# Patient Record
Sex: Female | Born: 1970 | Race: Black or African American | Hispanic: No | Smoking: Never smoker
Health system: Southern US, Community
[De-identification: ages and names within clinical notes are randomized; demographics above are authoritative.]

## PROBLEM LIST (undated history)

## (undated) DIAGNOSIS — I639 Cerebral infarction, unspecified: Secondary | ICD-10-CM

## (undated) DIAGNOSIS — N939 Abnormal uterine and vaginal bleeding, unspecified: Secondary | ICD-10-CM

## (undated) DIAGNOSIS — K589 Irritable bowel syndrome without diarrhea: Secondary | ICD-10-CM

## (undated) DIAGNOSIS — K219 Gastro-esophageal reflux disease without esophagitis: Secondary | ICD-10-CM

## (undated) DIAGNOSIS — T8859XA Other complications of anesthesia, initial encounter: Secondary | ICD-10-CM

## (undated) DIAGNOSIS — G4730 Sleep apnea, unspecified: Secondary | ICD-10-CM

## (undated) DIAGNOSIS — R350 Frequency of micturition: Secondary | ICD-10-CM

## (undated) HISTORY — DX: Irritable bowel syndrome, unspecified: K58.9

## (undated) HISTORY — PX: WISDOM TOOTH EXTRACTION: SHX21

## (undated) HISTORY — DX: Cerebral infarction, unspecified: I63.9

---

## 1998-08-06 HISTORY — PX: LEEP: SHX91

## 1998-08-10 ENCOUNTER — Ambulatory Visit (HOSPITAL_COMMUNITY): Admission: RE | Admit: 1998-08-10 | Discharge: 1998-08-10 | Payer: Self-pay | Admitting: Urology

## 1998-08-10 ENCOUNTER — Encounter: Payer: Self-pay | Admitting: Urology

## 1999-06-02 ENCOUNTER — Other Ambulatory Visit: Admission: RE | Admit: 1999-06-02 | Discharge: 1999-06-02 | Payer: Self-pay | Admitting: Obstetrics and Gynecology

## 1999-10-09 ENCOUNTER — Other Ambulatory Visit: Admission: RE | Admit: 1999-10-09 | Discharge: 1999-10-09 | Payer: Self-pay | Admitting: *Deleted

## 1999-11-07 ENCOUNTER — Other Ambulatory Visit: Admission: RE | Admit: 1999-11-07 | Discharge: 1999-11-07 | Payer: Self-pay | Admitting: *Deleted

## 1999-11-07 ENCOUNTER — Encounter (INDEPENDENT_AMBULATORY_CARE_PROVIDER_SITE_OTHER): Payer: Self-pay | Admitting: Specialist

## 1999-12-01 ENCOUNTER — Encounter (INDEPENDENT_AMBULATORY_CARE_PROVIDER_SITE_OTHER): Payer: Self-pay

## 1999-12-01 ENCOUNTER — Ambulatory Visit (HOSPITAL_COMMUNITY): Admission: RE | Admit: 1999-12-01 | Discharge: 1999-12-01 | Payer: Self-pay | Admitting: *Deleted

## 2000-04-18 ENCOUNTER — Other Ambulatory Visit: Admission: RE | Admit: 2000-04-18 | Discharge: 2000-04-18 | Payer: Self-pay | Admitting: Obstetrics & Gynecology

## 2000-08-06 DIAGNOSIS — I639 Cerebral infarction, unspecified: Secondary | ICD-10-CM

## 2000-08-06 HISTORY — DX: Cerebral infarction, unspecified: I63.9

## 2001-07-30 ENCOUNTER — Encounter: Payer: Self-pay | Admitting: Emergency Medicine

## 2001-07-31 ENCOUNTER — Inpatient Hospital Stay (HOSPITAL_COMMUNITY): Admission: EM | Admit: 2001-07-31 | Discharge: 2001-08-05 | Payer: Self-pay | Admitting: Emergency Medicine

## 2001-07-31 ENCOUNTER — Encounter: Payer: Self-pay | Admitting: Emergency Medicine

## 2001-10-03 ENCOUNTER — Ambulatory Visit (HOSPITAL_BASED_OUTPATIENT_CLINIC_OR_DEPARTMENT_OTHER): Admission: RE | Admit: 2001-10-03 | Discharge: 2001-10-03 | Payer: Self-pay | Admitting: Family Medicine

## 2002-01-26 ENCOUNTER — Other Ambulatory Visit: Admission: RE | Admit: 2002-01-26 | Discharge: 2002-01-26 | Payer: Self-pay | Admitting: Obstetrics and Gynecology

## 2003-03-07 ENCOUNTER — Emergency Department (HOSPITAL_COMMUNITY): Admission: EM | Admit: 2003-03-07 | Discharge: 2003-03-08 | Payer: Self-pay | Admitting: Emergency Medicine

## 2005-07-13 ENCOUNTER — Other Ambulatory Visit: Admission: RE | Admit: 2005-07-13 | Discharge: 2005-07-13 | Payer: Self-pay | Admitting: Obstetrics and Gynecology

## 2007-12-08 ENCOUNTER — Other Ambulatory Visit: Admission: RE | Admit: 2007-12-08 | Discharge: 2007-12-08 | Payer: Self-pay | Admitting: Obstetrics and Gynecology

## 2009-01-21 ENCOUNTER — Other Ambulatory Visit: Admission: RE | Admit: 2009-01-21 | Discharge: 2009-01-21 | Payer: Self-pay | Admitting: Obstetrics and Gynecology

## 2010-03-02 ENCOUNTER — Other Ambulatory Visit: Admission: RE | Admit: 2010-03-02 | Discharge: 2010-03-02 | Payer: Self-pay | Admitting: Obstetrics and Gynecology

## 2010-06-16 ENCOUNTER — Emergency Department (HOSPITAL_COMMUNITY): Admission: EM | Admit: 2010-06-16 | Discharge: 2010-06-16 | Payer: Self-pay | Admitting: Emergency Medicine

## 2010-09-10 ENCOUNTER — Emergency Department (HOSPITAL_COMMUNITY): Payer: BC Managed Care – PPO

## 2010-09-10 ENCOUNTER — Emergency Department (HOSPITAL_COMMUNITY)
Admission: EM | Admit: 2010-09-10 | Discharge: 2010-09-10 | Disposition: A | Discharge status: Home / Self Care | Payer: BC Managed Care – PPO | Attending: Emergency Medicine | Admitting: Emergency Medicine

## 2010-09-10 DIAGNOSIS — I10 Essential (primary) hypertension: Secondary | ICD-10-CM | POA: Insufficient documentation

## 2010-09-10 DIAGNOSIS — R1013 Epigastric pain: Secondary | ICD-10-CM | POA: Insufficient documentation

## 2010-09-10 DIAGNOSIS — R0789 Other chest pain: Secondary | ICD-10-CM | POA: Insufficient documentation

## 2010-09-10 DIAGNOSIS — R350 Frequency of micturition: Secondary | ICD-10-CM | POA: Insufficient documentation

## 2010-09-10 DIAGNOSIS — K219 Gastro-esophageal reflux disease without esophagitis: Secondary | ICD-10-CM | POA: Insufficient documentation

## 2010-09-10 DIAGNOSIS — Z8679 Personal history of other diseases of the circulatory system: Secondary | ICD-10-CM | POA: Insufficient documentation

## 2010-09-10 DIAGNOSIS — R110 Nausea: Secondary | ICD-10-CM | POA: Insufficient documentation

## 2010-09-10 LAB — URINALYSIS, ROUTINE W REFLEX MICROSCOPIC
Bilirubin Urine: NEGATIVE
Hgb urine dipstick: NEGATIVE
Specific Gravity, Urine: 1.017 (ref 1.005–1.030)
Urine Glucose, Fasting: NEGATIVE mg/dL
Urobilinogen, UA: 1 mg/dL (ref 0.0–1.0)

## 2010-09-10 LAB — DIFFERENTIAL
Basophils Absolute: 0 10*3/uL (ref 0.0–0.1)
Basophils Relative: 0 % (ref 0–1)
Eosinophils Absolute: 0.1 10*3/uL (ref 0.0–0.7)
Eosinophils Relative: 1 % (ref 0–5)
Lymphocytes Relative: 28 % (ref 12–46)
Lymphs Abs: 2.1 10*3/uL (ref 0.7–4.0)
Monocytes Absolute: 0.4 10*3/uL (ref 0.1–1.0)
Monocytes Relative: 5 % (ref 3–12)
Neutro Abs: 4.9 10*3/uL (ref 1.7–7.7)
Neutrophils Relative %: 66 % (ref 43–77)

## 2010-09-10 LAB — LIPASE, BLOOD: Lipase: 23 U/L (ref 11–59)

## 2010-09-10 LAB — COMPREHENSIVE METABOLIC PANEL
ALT: 30 U/L (ref 0–35)
AST: 45 U/L — ABNORMAL HIGH (ref 0–37)
Alkaline Phosphatase: 66 U/L (ref 39–117)
CO2: 25 mEq/L (ref 19–32)
Chloride: 101 mEq/L (ref 96–112)
GFR calc Af Amer: >60 mL/min (ref >60)
GFR calc non Af Amer: >60 mL/min (ref >60)
Glucose, Bld: 113 mg/dL — ABNORMAL HIGH (ref 70–99)
Potassium: 3.7 mEq/L (ref 3.5–5.1)
Sodium: 137 mEq/L (ref 135–145)
Total Bilirubin: 0.3 mg/dL (ref 0.3–1.2)

## 2010-09-10 LAB — CBC
HCT: 38.6 % (ref 36.0–46.0)
Hemoglobin: 13 g/dL (ref 12.0–15.0)
MCH: 30 pg (ref 26.0–34.0)
MCHC: 33.7 g/dL (ref 30.0–36.0)
MCV: 89.1 fL (ref 78.0–100.0)
Platelets: 241 10*3/uL (ref 150–400)
RBC: 4.33 MIL/uL (ref 3.87–5.11)
RDW: 12.7 % (ref 11.5–15.5)
WBC: 7.5 10*3/uL (ref 4.0–10.5)

## 2010-12-22 NOTE — Op Note (Signed)
[facility]  Patient:    Christine Bernard, Christine Bernard                     MRN: [id number] Proc. Date: 12/01/99 Adm. Date:  [id number] Attending:  Pleas Koch                           Operative Report  PREOPERATIVE DIAGNOSIS:       High grade cervical dysplasia.  POSTOPERATIVE DIAGNOSIS:      High grade cervical dysplasia.  OPERATION PERFORMED:          Loop electrosurgical excision procedure (LEEP).  SURGEON:                      Georgina Peer, M.D.  ANESTHESIA:                   IV sedation with 2% Xylocaine intracervical block  using 8 cc of local.  ESTIMATED BLOOD LOSS:         Less than 20 cc.  COMPLICATIONS:                None.  INDICATIONS:                  A 40 year old gravida 0 with abnormal Pap smear worked up with colposcopy and biopsy showing high grade cervical dysplasia. HPV DNA typing showed high risk virus that was detected.  She is brought in for excision of this abnormal area on the anterior lip of the cervix.  DESCRIPTION OF PROCEDURE:     The patient was taken to the operating room and given an IV sedation.  The speculum was placed in the vagina and acetic acid and Lugols iodine stained the cervix.  Previously noted anterior lip lesion was seen. Injection in the four quadrants of the cervix.  A total of 8 cc caused blanching of the cervix.  A green-handled loop electrode at an 8 mm depth was used to excise the transformation zone.  Ball cautery on the outside of the ectocervix and also in the LEEP crater was accomplished to control hemostasis.  Monsels was placed in the LEEP crater.  All other instruments were removed.  Sponge, needle, and instrument counts were correct.  The patient tolerated the procedure well.  The specimen was cut and marked by pins and a cork board.  Patient returned to the recovery area in good condition.  All counts were correct. DD:  12/03/99 TD:  12/03/99 Job: [id number]

## 2010-12-22 NOTE — Consult Note (Signed)
[place]. [facility]  Patient:    Christine Bernard, Christine Bernard Visit Number: [id number] MRN: [id number]          Service Type: OBV Location: 3000 3027 01 Attending Physician:  Madaline Guthrie Dictated by:   Marlan Palau, M.D. Proc. Date: 08/01/01 Admit Date:  07/30/2001   CC:         [facility]   Consultation Report  HISTORY OF PRESENT ILLNESS:  The patient is a 40 year old right-handed black female born 27-Mar-1971, with a history of obesity and obstructive sleep apnea. This patient comes into [facility] Emergency Room on July 30, 2001, with onset of lethargy, slight confusion, and double vision. The patient is not clear exactly when the deficit began but believes that she as feeling well initially when she got up but then recalls feeling poorly sometime around 9 oclock in the morning when the phone rang. The patient did report dizziness, had poor memory for events during the day of admission, and had some balance problems when up walking. Again, the family reports some confusion. She was reporting double vision and had some slurred speech. The patient does not recall headache or numbness or weakness on the arms, legs, or face. The patient was brought into the emergency room and was set up for further evaluation. Initial head scan was unremarkable, but an MRI scan of the brain done showed evidence of an acute left thalamic stroke with some questionable irregularities of the medium sized arteries on MRI angiogram. This patient has recovered at this point and is back to baseline and neurology was asked to see this patient for further evaluation. A 2-D echocardiogram has been performed, but the results are pending at this time.  PAST MEDICAL HISTORY: 1. History of obesity. 2. History of new-onset left thalamic stroke. 3. History of obstructive sleep apnea. 4. History of marijuana abuse.  CURRENT MEDICATIONS:   Depro-Provera prior to  admission.  SOCIAL HISTORY:  The patient smokes on occasion, drinks on occasion, does use marijuana, denies cocaine. The patient lives in the [place] area, is single, has no children, works as a Armed forces training and education officer.  PAST SURGICAL HISTORY:   Cervical dysplasia. May have had a conization in the past.  FAMILY MEDICAL HISTORY:  Notable for the fact that the mother is alive and well. Father is alive, unknown medical history. The patient has 2 brothers who are alive and well. There is a history of diabetes on the mothers side of the family and on the fathers side of the family. There is heart disease on the mothers side of the family.  REVIEW OF SYSTEMS:  Notable for no fevers or chills. The patient notes occasional headaches once every 2 weeks or so, bifrontal in nature, unassociated with any neurologic symptoms. The patient denies any shortness of breath, chest pains, nausea, vomiting. She denies any blackout episodes in the past. The patient also denies palpitations of the heart.  PHYSICAL EXAMINATION:  VITAL SIGNS:  Blood pressure 105/59, heart rate 82, respirations 20, temperature afebrile.  GENERAL:  This patient is a markedly obese black female who is alert and cooperative at the time of the examination.  HEENT:   Head is atraumatic. Eyes:  Pupils are equal, round, and reactive to light. Disks are flat bilaterally.  NECK:  Supple. No carotid bruits noted.  RESPIRATORY:  Clear.  CARDIOVASCULAR:  Regular rate and rhythm, no obvious murmurs or rubs noted.  EXTREMITIES:  Without significant edema.  NEUROLOGIC:  Cranial nerves as above. Facial symmetry is present. The patient notes good sensation of face to pinprick and soft touch bilaterally. The patient has good strength in facial muscles and the muscles of the head turning and shoulder shrug bilaterally. The patient has full visual fields. Speech is well enunciated, not aphasic. The patient has good strength in all  4 extremities, good symmetric motor tones noted throughout. Sensory testing is intact to pinprick and soft touch, and vibratory sensation throughout. The patient has good finger-to-nose, finger-to-toe-to finger bilaterally. The patient has full ability to ambulate, has fair tandem gait. Romberg negative. No evidence of pronator drift is seen. Deep tendon reflexes are depressed but symmetric. Toes are neutral bilaterally.  LABORATORY AND ACCESSORY DATA:  Notable for sodium 138, potassium 3.4, chloride 108, CO2 23, glucose 94, BUN 12, creatinine 0.7, calcium 8.7.  CPK 94, INR 1.2. White count 7.3, hemoglobin 12.9, hematocrit 37.5, MCV 87.0, platelets 196. TSH 1.621, INR 1.2, calcium 8.7. Urine pregnancy test negative, drug screen positive for THC, otherwise negative.  IMPRESSION: 1. New onset of left thalamic stroke, etiology unclear. 2. Obstructive sleep apnea. 3. History of headache, questionable migraine.  It is possible that this patient may have migraine. The patient, however, has had no prior neurologic symptomatology with the migraine. The patient has sustained an acute stroke at this point and needs to be undertaking a fairly aggressive stroke workup at this point.  PLAN: 1. A 2-D echocardiogram has been performed. Would recommend    transesophageal echocardiogram if the 2-D echocardiogram does not    show any significant abnormalities, need to rule out patent foramen ovale. 2. If not already done, would check antithrombin III level, protein S, protein    C, antiphospholipid antibody profile. 3. Aspirin therapy. 4. Consider discontinuing the Depro-Provera and total smoking    cessation. 5. EKG at this point.  We will follow the patients clinical course while in-house. The patient is essentially baseline at this point. Dictated by:   Marlan Palau, M.D. Attending Physician:  Madaline Guthrie DD:  08/01/01 TD:  08/01/01 Job: [id number]

## 2010-12-22 NOTE — Discharge Summary (Signed)
[place]. [facility]  Patient:    Christine Bernard, Christine Bernard Visit Number: [id number] MRN: [id number]          Service Type: MED Location: 3000 3027 01 Attending Physician:  Alfonso Ramus Dictated by:   Hillery Aldo, M.D. Admit Date:  07/30/2001 Discharge Date: 08/05/2001   CC:         Charles A. Tenny Craw, M.D., [employer]. Med., [facility], [address]                           Discharge Summary  DISCHARGE DIAGNOSES: 1. Thalamic stroke. 2. Obesity. 3. Obstructive sleep apnea. 4. Tobacco abuse. 5. History of marijuana abuse.  DISCHARGE MEDICATIONS WITH DOSAGES: 1. Aspirin 325 mg p.o. q.d. 2. Mega-B vitamin 1 p.o. q.d. 3. Elavil 25 mg p.o. q.h.s.  FOLLOW-UP WITH DATES AND APPOINTMENTS:  The patient is to follow up with her primary care physician, Dr. Leonette Most. ATenny Craw, on 08/13/01 at 3:30 p.m.  At that time, Dr. Tenny Craw will follow up on any outstanding laboratory values including a homocystine level that was drawn prior to the patient being discharged.  PROCEDURES AND DIAGNOSTIC STUDIES: 1. CT of head with and without contrast:  07/30/01:  Negative for acute bleed. 2. MRA/MRI of the brain with and without contrast:  07/31/01:  Acute    nonhemorrhagic infarct involving the left thalmus.  Major intracranial    vascular structures appear intact. 3. TE:  08/01/01:  LV ejection fraction 60%.  Study was inadequate for the    evaluation of left ventricular regional wall motion.  No obvious cardiac    source of embolus. 4. TEE:  08/05/01:  Normal LV function, normal left atrium (no left atrial    thrombus), normal right atrium, normal right ventricle.  No significant    atherosclerosis of descending aorta.  Normal trileaflet, aortic valve.  No    AI. Normal mitral valve, trace mitral regurgitation, trace tricuspid    regurgitation.  No ______  by color.  Negative saline microcavitation    study.  CONSULTANTS:  Marlan Palau, M.D.,  neurology.  ADMISSION HISTORY AND PHYSICAL:  The patient is a 40 year old black female with a history of obesity and obstructive sleep apnea, who came to the emergency department on 07/30/01, with onset of lethargy, slight confusion, and double-vision.  The patient was unable to ascertain when exactly her deficit began but believed that she was feeling well initially when she got up that morning but then felt poorly sometime around 9 a.m.  The patient also reports some dizziness.  The patient had poor memory for the events during the day of admission and had some balance problems when walking.  Notably, the family reported that the patient was confused.  The patient did not report any headache, numbness, or weakness on the arms, legs, or face.  The patient was brought to the emergency department for evaluation.  Initial CT scan of the head was unremarkable, but an MRI scan of the brain done showed evidence of an acute left thalamic stroke with some questionable irregularities of the medium-sized artery on MRI angiogram.  The patient subsequently recovered over the course of her hospitalization and is back to baseline at present. Neurology was asked to see the patient for recommendations.  A vasculitis and coagulopathy work-up was negative.  Additionally, a search for an embolic source was also not found.  The only medication the patient reported prior to  admission was Depo-Provera injections for birth control.  LABORATORY VALUES:  Protein C, normal at 69.  Protein S, normal at 127. C ANCA 1:16, scleroderma antibody normal at 7.  ______ antibody 3.  ESR initially 50, decreased to 35 on 08/02/01.  PT:  14.4, INR:  1.2, PTT:  31. No evidence of factor V Leiden mutation.  Initial chemistries showed a sodium of 142, potassium 3.9, chloride 108, BUN 11, creatinine 1.0, and glucose 88. TSH was 1.621.  Initial CBC showed a white count of 5.9, hemoglobin 13.3, hematocrit 38.4, and platelet count  of 239.  Her RPR was nonreactive.  HOSPITAL COURSE: #1 - NEUROLOGICAL DEFICIT:  The patient stabilized overnight.  She did appear to have some residual confusion on the morning of 07/31/01.  There was a question as to whether some of her symptoms were related to somnolence secondary secondary to obstructive sleep apnea.  She was seen by neurology on 08/01/01.  The patient was placed on aspirin therapy.  A further diagnostic work-up did not reveal any obvious source for her stroke.  Neurology eventually recommended keeping her on aspirin therapy.  The patient was also instructed to discontinue any hormonal-type of birth control and to discontinue smoking.  The risks of not discontinuing these things was discussed with the patient who verbalized understanding.  #2 - OBSTRUCTIVE SLEEP APNEA:  The patient had a sleep study done at the [facility].  She was maintained on Elavil 25 mg at bedtime to suppress REM-related apnea.  The patient was discharged on this medication.  DISPOSITION:  The patient was discharged home on 08/05/01.  The patient was instructed to follow up with her primary care physician, Dr. Tenny Craw, as described above.  Additionally, the patient had a homocystine level drawn prior to her leaving.  She was put on Mega-B vitamin therapy; however, this may be discontinued if her homocystine level is not elevated.  She understands she is to take an aspirin a day for stroke prevention.  She also understands that she will need to avoid any hormonal-type birth control methods.  She was instructed to follow up with her gynecologist for alternative birth control options.  The patient verbalized understanding of these discharge instructions prior to leaving. Dictated by:   Hillery Aldo, M.D. Attending Physician:  Alfonso Ramus DD:  08/05/01 TD:  08/05/01 Job: [id number]

## 2010-12-22 NOTE — Consult Note (Signed)
[place]. [facility]  Patient:    Christine, Bernard Visit Number: [id number] MRN: [id number]          Service Type: OBV Location: 3000 3027 01 Attending Physician:  Madaline Guthrie Dictated by:   Marlan Palau, M.D. Proc. Date: 08/01/01 Admit Date:  07/30/2001   CC:         [facility]  Madaline Guthrie, M.D.   Consultation Report  HISTORY OF PRESENT ILLNESS:  Christine Bernard is a 40 year old right-handed black female born 09/08/1970 with a history of obesity and obstructive sleep apnea.  The patient comes into [facility] Emergency Room on July 30, 2001 with onset of lethargy, slight confusion, double vision.  Patient is not clear exactly when the deficit began, but believes she was feeling well initially when she got up but then recalls feeling poorly some time around 9:00 in the morning when the phone rang.  Patient did report dizziness, had poor memory for events during the day of admission, and had some balance problems when up walking.  Again, the family reports some confusion.  She was reporting double vision.  Had some slurred speech.  Patient does not recall headache, numbness, or weakness on the arms, legs, or face.  Patient was brought in through the emergency room and set up for further evaluation. Initial head scan was unremarkable but an MRI scan of the brain done showed evidence of an acute left thalamic stroke with some questionable irregularities of the medium sized arteries on MR angiogram.  This patient has recovered at this point, is back to baseline, and neurology was asked to see this patient for further evaluation.  A 2-D echocardiogram has been performed but the results are pending at this time.  PAST MEDICAL HISTORY: 1. History of obesity. 2. History of new onset left thalamic stroke. 3. History of obstructive sleep apnea. 4. History of marijuana abuse.  CURRENT MEDICATIONS:  Depo-Provera  prior to admission.  SOCIAL HISTORY:  Patient smokes on occasion, drinks on occasion, does use marijuana.  Denies cocaine.  Patient has a history of cervical dysplasia.  May have had a conization in the past.  Patient lives in the [place] area, is single, has no children.  Works as a Armed forces training and education officer.  FAMILY HISTORY:  Notable for the fact that mother is alive and well.  Father is alive.  Unknown medical history.  Patient has two brothers who are alive and well.  There is a history of diabetes in the mothers side of the family and on the fathers side of the family, heart disease on the mothers side of the family.  REVIEW OF SYSTEMS:  Notable for no fevers, chills.  Patient notes occasional headaches once every two weeks or so, bifrontal in nature, unassociated with any neurologic symptoms.  Patient denies any shortness of breath, chest pains, nausea, vomiting.  Denies any blackout episodes in the past.  Patient also denies palpitations of the heart.  PHYSICAL EXAMINATION  VITAL SIGNS:  Blood pressure 105/59, heart rate 82, respiratory rate 20, temperature afebrile.  GENERAL:  This patient is a markedly obese black female who is alert and cooperative at the time of examination.  HEENT:  Head is atraumatic.  Eyes:  Pupils are equal, round and reactive to light.  Disks are flat bilaterally.  NECK:  Supple.  No carotid bruits noted.  RESPIRATORY:  Clear.  CARDIOVASCULAR:  Regular rate and rhythm.  No obvious murmurs or rubs  noted.  EXTREMITIES:  Without significant edema.  NEUROLOGIC:  Cranial nerves as above.  Facial symmetry is present.  Patient notes good sensation of face to pin prick and soft touch bilaterally.  Patient has good strength to facial muscles and the muscles to head turn and shoulder shrug bilaterally.  Patient has full visual fields.  Speech is well enunciated, not aphasic.  Patient has good strength in all four extremities, good symmetric motor  tone noted throughout.  Sensory testing is intact to pin prick, soft touch, vibratory sensation throughout.  Patient has good finger-to-nose, toe-to-finger bilaterally.  Patient has full ability to ambulate.  Has fair tandem gait.  Romberg negative.  No evidence of pronator drift is seen.  Deep tendon reflexes are depressed, but symmetric.  Toes are neutral bilaterally.  LABORATORIES:  Sodium 138, potassium 3.4, chloride 108, CO2 23, glucose 94, BUN 12, creatinine 0.7, calcium 8.7, CPK 94, INR 1.2.  White count 7.3, hemoglobin 12.9, hematocrit 37.5, MCV 87.0, platelets 196,000.  TSH 1.621. Calcium 8.7.  Urine pregnancy test is negative.  Drug screen positive for THC, otherwise negative.  IMPRESSION: 1. New onset of left thalamic stroke, etiology unclear. 2. Obstructive sleep apnea. 3. History of headache, questionable migraine.  It is possible this patient may have migraine.  Patient, however, has had no prior neurologic symptomatology with the migraine.  Patient had sustained an acute stroke at this point and need to be undertaking fairly aggressive stroke work-up at this point.  PLAN: 1. A 2-D echocardiogram has been performed.  Would recommend a transesophageal    echocardiogram if the 2-D echocardiogram does not show any significant    abnormalities.  Need to rule out patent foramen ovale. 2. If not already done, would check antithrombin III level, protein S, protein    C, antiphospholipid antibody profile. 3. Aspirin therapy. 4. Consider discontinuing the Depo-Provera and total smoking cessation. 5. EKG at this point.  Will follow patients clinical course while in-house.    Patient is essentially baseline at this point. Dictated by:   Marlan Palau, M.D. Attending Physician:  Madaline Guthrie DD:  08/01/01 TD:  08/01/01 Job: [id number]

## 2012-09-16 ENCOUNTER — Other Ambulatory Visit: Payer: Self-pay | Admitting: Nurse Practitioner

## 2012-09-16 ENCOUNTER — Other Ambulatory Visit (HOSPITAL_COMMUNITY)
Admission: RE | Admit: 2012-09-16 | Discharge: 2012-09-16 | Disposition: A | Discharge status: Home / Self Care | Payer: BC Managed Care – PPO | Source: Ambulatory Visit | Attending: Obstetrics and Gynecology | Admitting: Obstetrics and Gynecology

## 2012-09-16 DIAGNOSIS — Z1151 Encounter for screening for human papillomavirus (HPV): Secondary | ICD-10-CM | POA: Insufficient documentation

## 2012-09-16 DIAGNOSIS — Z113 Encounter for screening for infections with a predominantly sexual mode of transmission: Secondary | ICD-10-CM | POA: Insufficient documentation

## 2012-09-16 DIAGNOSIS — Z124 Encounter for screening for malignant neoplasm of cervix: Secondary | ICD-10-CM | POA: Insufficient documentation

## 2012-10-01 ENCOUNTER — Other Ambulatory Visit: Payer: Self-pay | Admitting: Family Medicine

## 2012-10-01 DIAGNOSIS — R109 Unspecified abdominal pain: Secondary | ICD-10-CM

## 2012-10-07 ENCOUNTER — Ambulatory Visit
Admission: RE | Admit: 2012-10-07 | Discharge: 2012-10-07 | Disposition: A | Discharge status: Home / Self Care | Payer: BC Managed Care – PPO | Source: Ambulatory Visit | Attending: Family Medicine | Admitting: Family Medicine

## 2012-10-07 DIAGNOSIS — R109 Unspecified abdominal pain: Secondary | ICD-10-CM

## 2012-10-15 ENCOUNTER — Encounter (INDEPENDENT_AMBULATORY_CARE_PROVIDER_SITE_OTHER): Payer: Self-pay | Admitting: Surgery

## 2012-10-20 ENCOUNTER — Ambulatory Visit (INDEPENDENT_AMBULATORY_CARE_PROVIDER_SITE_OTHER): Payer: BC Managed Care – PPO | Admitting: Surgery

## 2012-10-20 ENCOUNTER — Encounter (INDEPENDENT_AMBULATORY_CARE_PROVIDER_SITE_OTHER): Payer: Self-pay | Admitting: Surgery

## 2012-10-20 VITALS — BP 126/77 | HR 79 | Temp 98.6°F | Resp 16 | Ht 63.0 in | Wt 281.6 lb

## 2012-10-20 DIAGNOSIS — K8010 Calculus of gallbladder with chronic cholecystitis without obstruction: Secondary | ICD-10-CM | POA: Insufficient documentation

## 2012-10-20 NOTE — Progress Notes (Signed)
Patient ID: Christine Bernard, female   DOB: 03-12-1971, 42 y.o.   MRN: [id number]  Chief Complaint  Patient presents with  . Abdominal Pain    gallbladder    HPI Christine Bernard is a 42 y.o. female.  Referred by Dr. Duane Lope for evaluation of gallbladder disease.  Abdominal Pain Pertinent negatives include no arthralgias, constipation, diarrhea, fever, headaches, hematuria, nausea or vomiting.   This is a 42 year old female who presents with several years of upper abdominal pain wrapping around to her back. This tended to occur after he eats spicy foods. There was no nausea vomiting or diarrhea associated with this. Her symptoms have worsened slightly. She has had a couple of emergency department visits to rule out cardiac disease. She recently was seen by Dr. Tenny Craw who obtained some blood work. This showed normal liver function tests. An ultrasound was performed which showed multiple shadowing gallstones but no wall thickening. She does have fatty infiltration of the liver. She is now referred for surgical evaluation for cholecystectomy. Past Medical History  Diagnosis Date  . Stroke 2002    thought to be secondary to Depo Provera use  . IBS (irritable bowel syndrome)     Past Surgical History  Procedure Laterality Date  . Leep  2000    History reviewed. No pertinent family history.  Social History History  Substance Use Topics  . Smoking status: Never Smoker   . Smokeless tobacco: Not on file  . Alcohol Use: Yes     Comment: Rare    No Known Allergies  Current Outpatient Prescriptions  Medication Sig Dispense Refill  . AMITRIPTYLINE HCL PO Take by mouth.      . oxybutynin (OXYTROL) 3.9 MG/24HR Place 1 patch onto the skin 2 (two) times a week.       No current facility-administered medications for this visit.    Review of Systems Review of Systems  Constitutional: Negative for fever, chills and unexpected weight change.  HENT: Negative for hearing loss, congestion, sore  throat, trouble swallowing and voice change.   Eyes: Negative for visual disturbance.  Respiratory: Negative for cough and wheezing.   Cardiovascular: Negative for chest pain, palpitations and leg swelling.  Gastrointestinal: Positive for abdominal pain and abdominal distention. Negative for nausea, vomiting, diarrhea, constipation, blood in stool and anal bleeding.  Genitourinary: Negative for hematuria, vaginal bleeding and difficulty urinating.  Musculoskeletal: Negative for arthralgias.  Skin: Negative for rash and wound.  Neurological: Negative for seizures, syncope and headaches.  Hematological: Negative for adenopathy. Does not bruise/bleed easily.  Psychiatric/Behavioral: Negative for confusion.    Blood pressure 126/77, pulse 79, temperature 98.6 F (37 C), temperature source Temporal, resp. rate 16, height 5\' 3"  (1.6 m), weight 281 lb 9.6 oz (127.733 kg).  Physical Exam Physical Exam WDWN in NAD HEENT:  EOMI, sclera anicteric Neck:  No masses, no thyromegaly Lungs:  CTA bilaterally; normal respiratory effort CV:  Regular rate and rhythm; no murmurs Abd:  +bowel sounds, soft, mild epigastric tenderness; no palpable masses Ext:  Well-perfused; no edema Skin:  Warm, dry; no sign of jaundice  Data Reviewed RADIOLOGY REPORT*  Clinical Data: Right upper quadrant abdominal pain  ABDOMINAL ULTRASOUND COMPLETE  Comparison: None.  Findings:  Gallbladder: Numerous small echogenic mobile gallstones noted  layering dependently in the gallbladder. Gallbladder wall  thickness is normal measuring 1.6 mm. No Murphy's sign elicited.  Common Bile Duct: Within normal limits in caliber.  Liver: Diffuse heterogeneous increased echogenicity compatible with  background fatty infiltration  or hepatic steatosis. No focal  abnormality appreciated or biliary dilatation.  IVC: Appears normal.  Pancreas: No abnormality identified.  Spleen: Within normal limits in size and echotexture.  Right  kidney: Normal in size and parenchymal echogenicity. No  evidence of mass or hydronephrosis.  Left kidney: Normal in size and parenchymal echogenicity. No  evidence of mass or hydronephrosis.  Abdominal Aorta: No aneurysm identified.  IMPRESSION:  Cholelithiasis  No biliary dilatation  Hepatic steatosis  Original Report Authenticated By: Judie Petit. Miles Costain, M.D.    Assessment    Chronic calculus cholecystitis     Plan    Laparoscopic cholecystectomy with intraoperative cholangiogram.  The surgical procedure has been discussed with the patient.  Potential risks, benefits, alternative treatments, and expected outcomes have been explained.  All of the patient's questions at this time have been answered.  The likelihood of reaching the patient's treatment goal is good.  The patient understand the proposed surgical procedure and wishes to proceed.         Aul Mangieri K. 10/20/2012, 11:47 AM

## 2012-10-27 ENCOUNTER — Encounter (HOSPITAL_COMMUNITY): Payer: Self-pay

## 2012-10-30 ENCOUNTER — Encounter (HOSPITAL_COMMUNITY)
Admission: RE | Admit: 2012-10-30 | Discharge: 2012-10-30 | Disposition: A | Discharge status: Home / Self Care | Payer: BC Managed Care – PPO | Source: Ambulatory Visit | Attending: Surgery | Admitting: Surgery

## 2012-10-30 ENCOUNTER — Encounter (HOSPITAL_COMMUNITY): Payer: Self-pay

## 2012-10-30 HISTORY — DX: Sleep apnea, unspecified: G47.30

## 2012-10-30 HISTORY — DX: Gastro-esophageal reflux disease without esophagitis: K21.9

## 2012-10-30 HISTORY — DX: Frequency of micturition: R35.0

## 2012-10-30 LAB — BASIC METABOLIC PANEL
BUN: 12 mg/dL (ref 6–23)
CO2: 26 mEq/L (ref 19–32)
Chloride: 103 mEq/L (ref 96–112)
Glucose, Bld: 98 mg/dL (ref 70–99)
Potassium: 3.6 mEq/L (ref 3.5–5.1)
Sodium: 139 mEq/L (ref 135–145)

## 2012-10-30 LAB — SURGICAL PCR SCREEN: Staphylococcus aureus: NEGATIVE

## 2012-10-30 LAB — CBC
HCT: 37 % (ref 36.0–46.0)
Hemoglobin: 12.7 g/dL (ref 12.0–15.0)
MCV: 86 fL (ref 78.0–100.0)
WBC: 6.1 10*3/uL (ref 4.0–10.5)

## 2012-10-30 NOTE — Pre-Procedure Instructions (Signed)
Christine Bernard  10/30/2012   Your procedure is scheduled on:  11-07-2012  Take East Elevators to 3rd floor  Report to [facility] at 1:30 PM.  Call this number if you have problems the morning of surgery: [phone]   Remember:   Do not eat food or drink liquids after midnight.   Take these medicines the morning of surgery with A SIP OF WATER: none   Do not wear jewelry, make-up or nail polish.  Do not wear lotions, powders, or perfumes. You may wear deodorant.  Do not shave 48 hours prior to surgery.   Do not bring valuables to the hospital.  Contacts, dentures or bridgework may not be worn into surgery.  Leave suitcase in the car. After surgery it may be brought to your room.   For patients admitted to the hospital, checkout time is 11:00 AM the day of discharge.   Patients discharged the day of surgery will not be allowed to drive home.   Name and phone number of your driver: ___________________________   Special Instructions: Shower using CHG 2 nights before surgery and the night before surgery.  If you shower the day of surgery use CHG.  Use special wash - you have one bottle of CHG for all showers.  You should use approximately 1/3 of the bottle for each shower.     Please read over the following fact sheets that you were given: Pain Booklet, Coughing and Deep Breathing and Surgical Site Infection Prevention

## 2012-11-06 MED ORDER — DEXTROSE 5 % IV SOLN
3.0000 g | INTRAVENOUS | Status: AC
  Administered 2012-11-07: 3 g via INTRAVENOUS
  Filled 2012-11-06: qty 3000

## 2012-11-07 ENCOUNTER — Ambulatory Visit (HOSPITAL_COMMUNITY)
Admission: RE | Admit: 2012-11-07 | Discharge: 2012-11-07 | Disposition: A | Discharge status: Home / Self Care | Payer: BC Managed Care – PPO | Source: Ambulatory Visit | Attending: Surgery | Admitting: Surgery

## 2012-11-07 ENCOUNTER — Encounter (HOSPITAL_COMMUNITY): Payer: Self-pay | Admitting: Anesthesiology

## 2012-11-07 ENCOUNTER — Encounter (HOSPITAL_COMMUNITY): Payer: Self-pay | Admitting: *Deleted

## 2012-11-07 ENCOUNTER — Ambulatory Visit (HOSPITAL_COMMUNITY): Payer: BC Managed Care – PPO | Admitting: Anesthesiology

## 2012-11-07 ENCOUNTER — Encounter (HOSPITAL_COMMUNITY)
Admission: RE | Disposition: A | Discharge status: Home / Self Care | Payer: Self-pay | Source: Ambulatory Visit | Attending: Surgery

## 2012-11-07 ENCOUNTER — Ambulatory Visit (HOSPITAL_COMMUNITY): Payer: BC Managed Care – PPO

## 2012-11-07 DIAGNOSIS — Z8673 Personal history of transient ischemic attack (TIA), and cerebral infarction without residual deficits: Secondary | ICD-10-CM | POA: Insufficient documentation

## 2012-11-07 DIAGNOSIS — K8010 Calculus of gallbladder with chronic cholecystitis without obstruction: Secondary | ICD-10-CM

## 2012-11-07 DIAGNOSIS — K824 Cholesterolosis of gallbladder: Secondary | ICD-10-CM

## 2012-11-07 DIAGNOSIS — G4730 Sleep apnea, unspecified: Secondary | ICD-10-CM | POA: Insufficient documentation

## 2012-11-07 DIAGNOSIS — K589 Irritable bowel syndrome without diarrhea: Secondary | ICD-10-CM | POA: Insufficient documentation

## 2012-11-07 DIAGNOSIS — Z79899 Other long term (current) drug therapy: Secondary | ICD-10-CM | POA: Insufficient documentation

## 2012-11-07 HISTORY — PX: CHOLECYSTECTOMY: SHX55

## 2012-11-07 SURGERY — LAPAROSCOPIC CHOLECYSTECTOMY WITH INTRAOPERATIVE CHOLANGIOGRAM
Anesthesia: General | Site: Abdomen | Wound class: Clean Contaminated

## 2012-11-07 MED ORDER — SODIUM CHLORIDE 0.9 % IV SOLN
INTRAVENOUS | Status: DC | PRN
  Administered 2012-11-07: 15:00:00

## 2012-11-07 MED ORDER — SUCCINYLCHOLINE CHLORIDE 20 MG/ML IJ SOLN
INTRAMUSCULAR | Status: DC | PRN
  Administered 2012-11-07: 120 mg via INTRAVENOUS

## 2012-11-07 MED ORDER — PROPOFOL 10 MG/ML IV BOLUS
INTRAVENOUS | Status: DC | PRN
  Administered 2012-11-07: 200 mg via INTRAVENOUS

## 2012-11-07 MED ORDER — ROCURONIUM BROMIDE 100 MG/10ML IV SOLN
INTRAVENOUS | Status: DC | PRN
  Administered 2012-11-07: 30 mg via INTRAVENOUS
  Administered 2012-11-07: 10 mg via INTRAVENOUS

## 2012-11-07 MED ORDER — OXYCODONE-ACETAMINOPHEN 5-325 MG PO TABS: 1.0000 | ORAL_TABLET | ORAL | Status: DC | PRN

## 2012-11-07 MED ORDER — FENTANYL CITRATE 0.05 MG/ML IJ SOLN
INTRAMUSCULAR | Status: DC | PRN
  Administered 2012-11-07: 150 ug via INTRAVENOUS
  Administered 2012-11-07: 50 ug via INTRAVENOUS

## 2012-11-07 MED ORDER — ONDANSETRON HCL 4 MG/2ML IJ SOLN: 4.0000 mg | INTRAMUSCULAR | Status: DC | PRN

## 2012-11-07 MED ORDER — ARTIFICIAL TEARS OP OINT
TOPICAL_OINTMENT | OPHTHALMIC | Status: DC | PRN
  Administered 2012-11-07: 1 via OPHTHALMIC

## 2012-11-07 MED ORDER — 0.9 % SODIUM CHLORIDE (POUR BTL) OPTIME
TOPICAL | Status: DC | PRN
  Administered 2012-11-07: 1000 mL

## 2012-11-07 MED ORDER — KETOROLAC TROMETHAMINE 30 MG/ML IJ SOLN
INTRAMUSCULAR | Status: DC | PRN
  Administered 2012-11-07: 30 mg via INTRAVENOUS

## 2012-11-07 MED ORDER — LACTATED RINGERS IV SOLN
INTRAVENOUS | Status: DC
  Administered 2012-11-07: 14:00:00 via INTRAVENOUS

## 2012-11-07 MED ORDER — GLYCOPYRROLATE 0.2 MG/ML IJ SOLN
INTRAMUSCULAR | Status: DC | PRN
  Administered 2012-11-07: .8 mg via INTRAVENOUS

## 2012-11-07 MED ORDER — SODIUM CHLORIDE 0.9 % IR SOLN
Status: DC | PRN
  Administered 2012-11-07: 1000 mL

## 2012-11-07 MED ORDER — LIDOCAINE HCL 4 % MT SOLN
OROMUCOSAL | Status: DC | PRN
  Administered 2012-11-07: 4 mL via TOPICAL

## 2012-11-07 MED ORDER — LIDOCAINE HCL (CARDIAC) 20 MG/ML IV SOLN
INTRAVENOUS | Status: DC | PRN
  Administered 2012-11-07: 70 mg via INTRAVENOUS

## 2012-11-07 MED ORDER — LACTATED RINGERS IV SOLN
INTRAVENOUS | Status: DC | PRN
  Administered 2012-11-07: 14:00:00 via INTRAVENOUS

## 2012-11-07 MED ORDER — NEOSTIGMINE METHYLSULFATE 1 MG/ML IJ SOLN
INTRAMUSCULAR | Status: DC | PRN
  Administered 2012-11-07: 5 mg via INTRAVENOUS

## 2012-11-07 MED ORDER — PHENYLEPHRINE HCL 10 MG/ML IJ SOLN
INTRAMUSCULAR | Status: DC | PRN
  Administered 2012-11-07 (×2): 80 ug via INTRAVENOUS

## 2012-11-07 MED ORDER — HYDROMORPHONE HCL PF 1 MG/ML IJ SOLN: 1.0000 mg | INTRAMUSCULAR | Status: DC | PRN

## 2012-11-07 MED ORDER — ONDANSETRON HCL 4 MG/2ML IJ SOLN
INTRAMUSCULAR | Status: DC | PRN
  Administered 2012-11-07: 4 mg via INTRAVENOUS

## 2012-11-07 MED ORDER — BUPIVACAINE-EPINEPHRINE 0.25% -1:200000 IJ SOLN
INTRAMUSCULAR | Status: AC
  Filled 2012-11-07: qty 1

## 2012-11-07 MED ORDER — BUPIVACAINE-EPINEPHRINE 0.25% -1:200000 IJ SOLN
INTRAMUSCULAR | Status: DC | PRN
  Administered 2012-11-07: 30 mL

## 2012-11-07 MED ORDER — CHLORHEXIDINE GLUCONATE 4 % EX LIQD: 1.0000 "application " | Freq: Once | CUTANEOUS | Status: DC

## 2012-11-07 MED ORDER — MIDAZOLAM HCL 5 MG/5ML IJ SOLN
INTRAMUSCULAR | Status: DC | PRN
  Administered 2012-11-07 (×2): 1 mg via INTRAVENOUS

## 2012-11-07 SURGICAL SUPPLY — 51 items
APL SKNCLS STERI-STRIP NONHPOA (GAUZE/BANDAGES/DRESSINGS) ×1
APPLIER CLIP ROT 10 11.4 M/L (STAPLE) ×2
APR CLP MED LRG 11.4X10 (STAPLE) ×1
BAG SPEC RTRVL LRG 6X4 10 (ENDOMECHANICALS) ×1
BENZOIN TINCTURE PRP APPL 2/3 (GAUZE/BANDAGES/DRESSINGS) ×2 IMPLANT
BLADE SURG ROTATE 9660 (MISCELLANEOUS) IMPLANT
CANISTER SUCTION 2500CC (MISCELLANEOUS) ×2 IMPLANT
CHLORAPREP W/TINT 26ML (MISCELLANEOUS) ×2 IMPLANT
CLIP APPLIE ROT 10 11.4 M/L (STAPLE) ×1 IMPLANT
CLOTH BEACON ORANGE TIMEOUT ST (SAFETY) ×2 IMPLANT
COVER MAYO STAND STRL (DRAPES) ×2 IMPLANT
COVER SURGICAL LIGHT HANDLE (MISCELLANEOUS) ×2 IMPLANT
DECANTER SPIKE VIAL GLASS SM (MISCELLANEOUS) ×3 IMPLANT
DRAPE C-ARM 42X72 X-RAY (DRAPES) ×2 IMPLANT
DRAPE UTILITY 15X26 W/TAPE STR (DRAPE) ×4 IMPLANT
DRSG TEGADERM 4X4.75 (GAUZE/BANDAGES/DRESSINGS) ×2 IMPLANT
ELECT REM PT RETURN 9FT ADLT (ELECTROSURGICAL) ×2
ELECTRODE REM PT RTRN 9FT ADLT (ELECTROSURGICAL) ×1 IMPLANT
FILTER SMOKE EVAC LAPAROSHD (FILTER) ×2 IMPLANT
GAUZE SPONGE 2X2 8PLY STRL LF (GAUZE/BANDAGES/DRESSINGS) ×1 IMPLANT
GLOVE BIO SURGEON STRL SZ7 (GLOVE) ×2 IMPLANT
GLOVE BIOGEL PI IND STRL 6.5 (GLOVE) IMPLANT
GLOVE BIOGEL PI IND STRL 7.0 (GLOVE) IMPLANT
GLOVE BIOGEL PI IND STRL 7.5 (GLOVE) ×1 IMPLANT
GLOVE BIOGEL PI INDICATOR 6.5 (GLOVE) ×1
GLOVE BIOGEL PI INDICATOR 7.0 (GLOVE) ×1
GLOVE BIOGEL PI INDICATOR 7.5 (GLOVE) ×4
GLOVE ECLIPSE 7.5 STRL STRAW (GLOVE) ×1 IMPLANT
GLOVE EUDERMIC 7 POWDERFREE (GLOVE) ×1 IMPLANT
GLOVE SURG SS PI 6.0 STRL IVOR (GLOVE) ×1 IMPLANT
GLOVE SURG SS PI 7.0 STRL IVOR (GLOVE) ×1 IMPLANT
GOWN PREVENTION PLUS XLARGE (GOWN DISPOSABLE) ×1 IMPLANT
GOWN STRL NON-REIN LRG LVL3 (GOWN DISPOSABLE) ×9 IMPLANT
KIT BASIN OR (CUSTOM PROCEDURE TRAY) ×2 IMPLANT
KIT ROOM TURNOVER OR (KITS) ×2 IMPLANT
NS IRRIG 1000ML POUR BTL (IV SOLUTION) ×2 IMPLANT
PAD ARMBOARD 7.5X6 YLW CONV (MISCELLANEOUS) ×2 IMPLANT
POUCH SPECIMEN RETRIEVAL 10MM (ENDOMECHANICALS) ×2 IMPLANT
SCISSORS LAP 5X35 DISP (ENDOMECHANICALS) IMPLANT
SET CHOLANGIOGRAPH 5 50 .035 (SET/KITS/TRAYS/PACK) ×2 IMPLANT
SET IRRIG TUBING LAPAROSCOPIC (IRRIGATION / IRRIGATOR) ×2 IMPLANT
SLEEVE ENDOPATH XCEL 5M (ENDOMECHANICALS) ×2 IMPLANT
SPECIMEN JAR SMALL (MISCELLANEOUS) ×2 IMPLANT
SPONGE GAUZE 2X2 STER 10/PKG (GAUZE/BANDAGES/DRESSINGS) ×1
SUT MNCRL AB 4-0 PS2 18 (SUTURE) ×2 IMPLANT
TOWEL OR 17X24 6PK STRL BLUE (TOWEL DISPOSABLE) ×2 IMPLANT
TOWEL OR 17X26 10 PK STRL BLUE (TOWEL DISPOSABLE) ×2 IMPLANT
TRAY LAPAROSCOPIC (CUSTOM PROCEDURE TRAY) ×2 IMPLANT
TROCAR XCEL BLUNT TIP 100MML (ENDOMECHANICALS) ×2 IMPLANT
TROCAR XCEL NON-BLD 11X100MML (ENDOMECHANICALS) ×2 IMPLANT
TROCAR XCEL NON-BLD 5MMX100MML (ENDOMECHANICALS) ×2 IMPLANT

## 2012-11-07 NOTE — Preoperative (Signed)
Beta Blockers   Reason not to administer Beta Blockers: not prescribed 

## 2012-11-07 NOTE — Anesthesia Postprocedure Evaluation (Signed)
  Anesthesia Post-op Note  Patient: Christine Bernard  Procedure(s) Performed: Procedure(s): LAPAROSCOPIC CHOLECYSTECTOMY WITH INTRAOPERATIVE CHOLANGIOGRAM (N/A)  Patient Location: PACU  Anesthesia Type:General  Level of Consciousness: awake  Airway and Oxygen Therapy: Patient Spontanous Breathing  Post-op Pain: mild  Post-op Assessment: Post-op Vital signs reviewed  Post-op Vital Signs: Reviewed  Complications: No apparent anesthesia complications

## 2012-11-07 NOTE — H&P (View-Only) (Signed)
Patient ID: Christine Bernard, female   DOB: 06/26/1971, 42 y.o.   MRN: [id number]  Chief Complaint  Patient presents with  . Abdominal Pain    gallbladder    HPI Christine Bernard is a 42 y.o. female.  Referred by Dr. Alan Ross for evaluation of gallbladder disease.  Abdominal Pain Pertinent negatives include no arthralgias, constipation, diarrhea, fever, headaches, hematuria, nausea or vomiting.   This is a 42-year-old female who presents with several years of upper abdominal pain wrapping around to her back. This tended to occur after he eats spicy foods. There was no nausea vomiting or diarrhea associated with this. Her symptoms have worsened slightly. She has had a couple of emergency department visits to rule out cardiac disease. She recently was seen by Dr. Ross who obtained some blood work. This showed normal liver function tests. An ultrasound was performed which showed multiple shadowing gallstones but no wall thickening. She does have fatty infiltration of the liver. She is now referred for surgical evaluation for cholecystectomy. Past Medical History  Diagnosis Date  . Stroke 2002    thought to be secondary to Depo Provera use  . IBS (irritable bowel syndrome)     Past Surgical History  Procedure Laterality Date  . Leep  2000    History reviewed. No pertinent family history.  Social History History  Substance Use Topics  . Smoking status: Never Smoker   . Smokeless tobacco: Not on file  . Alcohol Use: Yes     Comment: Rare    No Known Allergies  Current Outpatient Prescriptions  Medication Sig Dispense Refill  . AMITRIPTYLINE HCL PO Take by mouth.      . oxybutynin (OXYTROL) 3.9 MG/24HR Place 1 patch onto the skin 2 (two) times a week.       No current facility-administered medications for this visit.    Review of Systems Review of Systems  Constitutional: Negative for fever, chills and unexpected weight change.  HENT: Negative for hearing loss, congestion, sore  throat, trouble swallowing and voice change.   Eyes: Negative for visual disturbance.  Respiratory: Negative for cough and wheezing.   Cardiovascular: Negative for chest pain, palpitations and leg swelling.  Gastrointestinal: Positive for abdominal pain and abdominal distention. Negative for nausea, vomiting, diarrhea, constipation, blood in stool and anal bleeding.  Genitourinary: Negative for hematuria, vaginal bleeding and difficulty urinating.  Musculoskeletal: Negative for arthralgias.  Skin: Negative for rash and wound.  Neurological: Negative for seizures, syncope and headaches.  Hematological: Negative for adenopathy. Does not bruise/bleed easily.  Psychiatric/Behavioral: Negative for confusion.    Blood pressure 126/77, pulse 79, temperature 98.6 F (37 C), temperature source Temporal, resp. rate 16, height 5' 3" (1.6 m), weight 281 lb 9.6 oz (127.733 kg).  Physical Exam Physical Exam WDWN in NAD HEENT:  EOMI, sclera anicteric Neck:  No masses, no thyromegaly Lungs:  CTA bilaterally; normal respiratory effort CV:  Regular rate and rhythm; no murmurs Abd:  +bowel sounds, soft, mild epigastric tenderness; no palpable masses Ext:  Well-perfused; no edema Skin:  Warm, dry; no sign of jaundice  Data Reviewed RADIOLOGY REPORT*  Clinical Data: Right upper quadrant abdominal pain  ABDOMINAL ULTRASOUND COMPLETE  Comparison: None.  Findings:  Gallbladder: Numerous small echogenic mobile gallstones noted  layering dependently in the gallbladder. Gallbladder wall  thickness is normal measuring 1.6 mm. No Murphy's sign elicited.  Common Bile Duct: Within normal limits in caliber.  Liver: Diffuse heterogeneous increased echogenicity compatible with  background fatty infiltration   or hepatic steatosis. No focal  abnormality appreciated or biliary dilatation.  IVC: Appears normal.  Pancreas: No abnormality identified.  Spleen: Within normal limits in size and echotexture.  Right  kidney: Normal in size and parenchymal echogenicity. No  evidence of mass or hydronephrosis.  Left kidney: Normal in size and parenchymal echogenicity. No  evidence of mass or hydronephrosis.  Abdominal Aorta: No aneurysm identified.  IMPRESSION:  Cholelithiasis  No biliary dilatation  Hepatic steatosis  Original Report Authenticated By: M. Shick, M.D.    Assessment    Chronic calculus cholecystitis     Plan    Laparoscopic cholecystectomy with intraoperative cholangiogram.  The surgical procedure has been discussed with the patient.  Potential risks, benefits, alternative treatments, and expected outcomes have been explained.  All of the patient's questions at this time have been answered.  The likelihood of reaching the patient's treatment goal is good.  The patient understand the proposed surgical procedure and wishes to proceed.         Brittanee Ghazarian K. 10/20/2012, 11:47 AM    

## 2012-11-07 NOTE — Anesthesia Preprocedure Evaluation (Signed)
Anesthesia Evaluation  Patient identified by MRN, date of birth, ID band Patient awake    Reviewed: Allergy & Precautions, H&P , NPO status , Patient's Chart, lab work & pertinent test results  Airway Mallampati: II      Dental   Pulmonary sleep apnea ,  breath sounds clear to auscultation        Cardiovascular negative cardio ROS  Rhythm:Regular Rate:Normal     Neuro/Psych CVA    GI/Hepatic Neg liver ROS, GERD-  ,  Endo/Other  negative endocrine ROS  Renal/GU negative Renal ROS     Musculoskeletal   Abdominal   Peds  Hematology   Anesthesia Other Findings   Reproductive/Obstetrics                           Anesthesia Physical Anesthesia Plan  ASA: III  Anesthesia Plan: General   Post-op Pain Management:    Induction: Intravenous  Airway Management Planned: Oral ETT  Additional Equipment:   Intra-op Plan:   Post-operative Plan:   Informed Consent: I have reviewed the patients History and Physical, chart, labs and discussed the procedure including the risks, benefits and alternatives for the proposed anesthesia with the patient or authorized representative who has indicated his/her understanding and acceptance.   Dental advisory given  Plan Discussed with: CRNA, Anesthesiologist and Surgeon  Anesthesia Plan Comments:         Anesthesia Quick Evaluation

## 2012-11-07 NOTE — Anesthesia Procedure Notes (Addendum)
Procedure Name: Intubation Date/Time: 11/07/2012 2:37 PM Performed by: Gayla Medicus Pre-anesthesia Checklist: Patient identified, Timeout performed, Emergency Drugs available, Suction available and Patient being monitored Patient Re-evaluated:Patient Re-evaluated prior to inductionOxygen Delivery Method: Circle system utilized Preoxygenation: Pre-oxygenation with 100% oxygen Intubation Type: IV induction, Rapid sequence and Cricoid Pressure applied Laryngoscope Size: Mac and 4 Grade View: Grade I Tube type: Oral Tube size: 7.5 mm Number of attempts: 1 Airway Equipment and Method: Stylet and LTA kit utilized Placement Confirmation: ETT inserted through vocal cords under direct vision,  positive ETCO2 and breath sounds checked- equal and bilateral Secured at: 21 cm Tube secured with: Tape Dental Injury: Teeth and Oropharynx as per pre-operative assessment

## 2012-11-07 NOTE — Op Note (Signed)
Laparoscopic Cholecystectomy with IOC Procedure Note  Indications: This patient presents with symptomatic gallbladder disease and will undergo laparoscopic cholecystectomy.  Pre-operative Diagnosis: Calculus of gallbladder with other cholecystitis, without mention of obstruction  Post-operative Diagnosis: Same  Surgeon: Trease Bremner K.   Assistants: Cicero Duck, MD  Anesthesia: General endotracheal anesthesia  ASA Class: 2  Procedure Details  The patient was seen again in the Holding Room. The risks, benefits, complications, treatment options, and expected outcomes were discussed with the patient. The possibilities of reaction to medication, pulmonary aspiration, perforation of viscus, bleeding, recurrent infection, finding a normal gallbladder, the need for additional procedures, failure to diagnose a condition, the possible need to convert to an open procedure, and creating a complication requiring transfusion or operation were discussed with the patient. The likelihood of improving the patient's symptoms with return to their baseline status is good.  The patient and/or family concurred with the proposed plan, giving informed consent. The site of surgery properly noted. The patient was taken to Operating Room, identified as Peter Daquila and the procedure verified as Laparoscopic Cholecystectomy with Intraoperative Cholangiogram. A Time Out was held and the above information confirmed.  Prior to the induction of general anesthesia, antibiotic prophylaxis was administered. General endotracheal anesthesia was then administered and tolerated well. After the induction, the abdomen was prepped with Chloraprep and draped in the sterile fashion. The patient was positioned in the supine position.  Local anesthetic agent was injected into the skin above the umbilicus and an incision made. We dissected down to the abdominal fascia with blunt dissection.  The fascia was incised vertically and we  entered the peritoneal cavity bluntly.  A pursestring suture of 0-Vicryl was placed around the fascial opening.  The Hasson cannula was inserted and secured with the stay suture.  Pneumoperitoneum was then created with CO2 and tolerated well without any adverse changes in the patient's vital signs. An 11-mm port was placed in the subxiphoid position.  Two 5-mm ports were placed in the right upper quadrant. All skin incisions were infiltrated with a local anesthetic agent before making the incision and placing the trocars.   We positioned the patient in reverse Trendelenburg, tilted slightly to the patient's left.  The gallbladder was identified, the fundus grasped and retracted cephalad. Adhesions were lysed bluntly and with the electrocautery where indicated, taking care not to injure any adjacent organs or viscus. The infundibulum was grasped and retracted laterally, exposing the peritoneum overlying the triangle of Calot. This was then divided and exposed in a blunt fashion. A critical view of the cystic duct and cystic artery was obtained.  The cystic duct was clearly identified and bluntly dissected circumferentially. The cystic duct was ligated with a clip distally.   An incision was made in the cystic duct and a large gallstone was expressed from the cystic duct.  The [facility] cholangiogram catheter was introduced into the cystic duct. The catheter was secured using a clip. A cholangiogram was then obtained which showed good visualization of the distal and proximal biliary tree with no sign of filling defects or obstruction.  Contrast flowed easily into the duodenum. The catheter was then removed.   The cystic duct was then ligated with clips and divided. The cystic artery was identified, dissected free, ligated with clips and divided as well.   The gallbladder was dissected from the liver bed in retrograde fashion with the electrocautery. The gallbladder was removed and placed in an Endocatch sac. The  liver bed was irrigated and inspected.  Hemostasis was achieved with the electrocautery. Copious irrigation was utilized and was repeatedly aspirated until clear.  The gallbladder and Endocatch sac were then removed through the umbilical port site.  The pursestring suture was used to close the umbilical fascia.    We again inspected the right upper quadrant for hemostasis.  Pneumoperitoneum was released as we removed the trocars.  4-0 Monocryl was used to close the skin.   Benzoin, steri-strips, and clean dressings were applied. The patient was then extubated and brought to the recovery room in stable condition. Instrument, sponge, and needle counts were correct at closure and at the conclusion of the case.   Findings: Cholecystitis with Cholelithiasis  Estimated Blood Loss: Minimal         Drains: none         Specimens: Gallbladder           Complications: None; patient tolerated the procedure well.         Disposition: PACU - hemodynamically stable.         Condition: stable  Wilmon Arms. Corliss Skains, MD, [facility]  11/07/2012 3:48 PM

## 2012-11-07 NOTE — Transfer of Care (Signed)
Immediate Anesthesia Transfer of Care Note  Patient: Christine Bernard  Procedure(s) Performed: Procedure(s): LAPAROSCOPIC CHOLECYSTECTOMY WITH INTRAOPERATIVE CHOLANGIOGRAM (N/A)  Patient Location: PACU  Anesthesia Type:General  Level of Consciousness: awake and alert   Airway & Oxygen Therapy: Patient Spontanous Breathing and Patient connected to face mask oxygen  Post-op Assessment: Report given to PACU RN and Post -op Vital signs reviewed and stable  Post vital signs: Reviewed and stable  Complications: No apparent anesthesia complications

## 2012-11-07 NOTE — Interval H&P Note (Signed)
History and Physical Interval Note:  11/07/2012 1:48 PM  Christine Bernard  has presented today for surgery, with the diagnosis of chronic calalus cholelithiasis  The various methods of treatment have been discussed with the patient and family. After consideration of risks, benefits and other options for treatment, the patient has consented to  Procedure(s): LAPAROSCOPIC CHOLECYSTECTOMY WITH INTRAOPERATIVE CHOLANGIOGRAM (N/A) as a surgical intervention .  The patient's history has been reviewed, patient examined, no change in status, stable for surgery.  I have reviewed the patient's chart and labs.  Questions were answered to the patient's satisfaction.     [name]

## 2012-11-10 ENCOUNTER — Encounter (HOSPITAL_COMMUNITY): Payer: Self-pay | Admitting: Surgery

## 2012-11-10 ENCOUNTER — Telehealth (INDEPENDENT_AMBULATORY_CARE_PROVIDER_SITE_OTHER): Payer: Self-pay | Admitting: General Surgery

## 2012-11-10 NOTE — Telephone Encounter (Signed)
Spoke with patient she is aware of appt  On 12/01/12

## 2012-12-01 ENCOUNTER — Ambulatory Visit (INDEPENDENT_AMBULATORY_CARE_PROVIDER_SITE_OTHER): Payer: BC Managed Care – PPO | Admitting: Surgery

## 2012-12-01 ENCOUNTER — Encounter (INDEPENDENT_AMBULATORY_CARE_PROVIDER_SITE_OTHER): Payer: Self-pay | Admitting: Surgery

## 2012-12-01 VITALS — BP 133/81 | HR 80 | Temp 98.1°F | Resp 16 | Ht 63.0 in | Wt 277.3 lb

## 2012-12-01 DIAGNOSIS — K8010 Calculus of gallbladder with chronic cholecystitis without obstruction: Secondary | ICD-10-CM

## 2012-12-01 NOTE — Progress Notes (Signed)
Status post laparoscopic cholecystectomy with intraoperative cholangiogram on 11/07/12. The pathology showed chronic calculus cholecystitis. The patient is doing quite well. No abdominal tenderness. She is back eating regular food without any difficulties. Bowel movements are formed with no sign of diarrhea or constipation. She feels excellent.  Her incisions seem to be healing well no sign of infection. She may resume full activity. Followup as needed  Wilmon Arms. Corliss Skains, MD, [facility]  12/01/2012 9:29 AM

## 2013-04-13 ENCOUNTER — Other Ambulatory Visit: Payer: Self-pay

## 2013-04-13 DIAGNOSIS — Z1231 Encounter for screening mammogram for malignant neoplasm of breast: Secondary | ICD-10-CM

## 2013-04-30 ENCOUNTER — Ambulatory Visit: Payer: BC Managed Care – PPO

## 2013-05-15 ENCOUNTER — Ambulatory Visit: Payer: BC Managed Care – PPO

## 2013-06-02 ENCOUNTER — Ambulatory Visit
Admission: RE | Admit: 2013-06-02 | Discharge: 2013-06-02 | Disposition: A | Discharge status: Home / Self Care | Payer: BC Managed Care – PPO | Source: Ambulatory Visit

## 2013-06-02 DIAGNOSIS — Z1231 Encounter for screening mammogram for malignant neoplasm of breast: Secondary | ICD-10-CM

## 2014-07-14 ENCOUNTER — Other Ambulatory Visit: Payer: Self-pay

## 2014-07-14 DIAGNOSIS — Z1231 Encounter for screening mammogram for malignant neoplasm of breast: Secondary | ICD-10-CM

## 2014-07-27 ENCOUNTER — Ambulatory Visit
Admission: RE | Admit: 2014-07-27 | Discharge: 2014-07-27 | Disposition: A | Discharge status: Home / Self Care | Payer: BC Managed Care – PPO | Source: Ambulatory Visit

## 2014-07-27 ENCOUNTER — Other Ambulatory Visit: Payer: Self-pay

## 2014-07-27 DIAGNOSIS — Z1231 Encounter for screening mammogram for malignant neoplasm of breast: Secondary | ICD-10-CM

## 2014-08-03 ENCOUNTER — Other Ambulatory Visit: Payer: Self-pay | Admitting: Obstetrics and Gynecology

## 2014-08-03 DIAGNOSIS — R928 Other abnormal and inconclusive findings on diagnostic imaging of breast: Secondary | ICD-10-CM

## 2014-08-16 ENCOUNTER — Ambulatory Visit
Admission: RE | Admit: 2014-08-16 | Discharge: 2014-08-16 | Disposition: A | Discharge status: Home / Self Care | Payer: No Typology Code available for payment source | Source: Ambulatory Visit | Attending: Obstetrics and Gynecology | Admitting: Obstetrics and Gynecology

## 2014-08-16 ENCOUNTER — Other Ambulatory Visit: Payer: Self-pay | Admitting: Obstetrics and Gynecology

## 2014-08-16 DIAGNOSIS — N6320 Unspecified lump in the left breast, unspecified quadrant: Secondary | ICD-10-CM

## 2014-08-16 DIAGNOSIS — R928 Other abnormal and inconclusive findings on diagnostic imaging of breast: Secondary | ICD-10-CM

## 2014-08-18 ENCOUNTER — Other Ambulatory Visit: Payer: Self-pay | Admitting: Obstetrics and Gynecology

## 2014-08-18 DIAGNOSIS — N6320 Unspecified lump in the left breast, unspecified quadrant: Secondary | ICD-10-CM

## 2014-08-26 ENCOUNTER — Ambulatory Visit
Admission: RE | Admit: 2014-08-26 | Discharge: 2014-08-26 | Disposition: A | Discharge status: Home / Self Care | Payer: No Typology Code available for payment source | Source: Ambulatory Visit | Attending: Obstetrics and Gynecology | Admitting: Obstetrics and Gynecology

## 2014-08-26 DIAGNOSIS — N6320 Unspecified lump in the left breast, unspecified quadrant: Secondary | ICD-10-CM

## 2014-11-18 ENCOUNTER — Other Ambulatory Visit: Payer: Self-pay | Admitting: Nurse Practitioner

## 2014-11-18 ENCOUNTER — Other Ambulatory Visit (HOSPITAL_COMMUNITY)
Admission: RE | Admit: 2014-11-18 | Discharge: 2014-11-18 | Disposition: A | Discharge status: Home / Self Care | Payer: No Typology Code available for payment source | Source: Ambulatory Visit | Attending: Obstetrics and Gynecology | Admitting: Obstetrics and Gynecology

## 2014-11-18 DIAGNOSIS — Z01419 Encounter for gynecological examination (general) (routine) without abnormal findings: Secondary | ICD-10-CM | POA: Insufficient documentation

## 2014-11-19 LAB — CYTOLOGY - PAP

## 2015-06-19 NOTE — H&P (Signed)
44yo G0 who presents for laparoscopic tubal ligation on 11/23. In review, pt was previously on Depot for over 4971yrs when she developed a stroke that per her physician was told it was due to the DMPA. She desires permanent sterilization as she does not desire a pregnancy in the future. An IUD was attempted with a previous doctor and they were unable to place the device. Menses are regular each month- denies heavy bleeding or cramping. Periods usually last about 3 days. She reports no acute complaints.   Current Medications  Taking   Oxybutynin Chloride 5MG  Tablet 1 tablet once a day   Vitamin D (Ergocalciferol) 50000 UNIT Capsule 1 capsule Twice a week for 12 weeks   Medication List reviewed and reconciled with the patient    Past Medical History  stroke (thought to be secondary to Depo Provera use) in 2002  CIN3 2000   Surgical History  gallbladder 11/2012  left breast bx- cyst 1.21.16   Family History  Father: alive 3056 yrs  Mother: alive 4256 yrs, heart murmur  Paternal Grand Father: deceased  Paternal Grand Mother: alive  Maternal Grand Father: deceased  Maternal Grand Mother: alive, Dementia  Brother 1: alive 36 yrs  Brother2: alive 5633 yrs  Paternal aunt: Breast cancer diagnosed before 10640??, diagnosed with Breast Ca  2 brother(s) .   On father's side, DM/HTN.  No MIs, stroke. No family history of pituitary disorders or endocrine neoplasia.   Social History  General:  Tobacco use  cigarettes: Never smoked Tobacco history last updated 06/09/2015 no EXPOSURE TO PASSIVE SMOKE.  no Alcohol.  Caffeine: soda.  no Recreational drug use.  DIET: not good, FF.  Exercise: walking, trying for 5 x week (goal).  Marital Status: Divorced.  Children: none.  OCCUPATION: employed, Veterinary surgeonealtor.  no Tobacco Exposure.  Spouse is incarcerated.   Gyn History  Sexual activity currently sexually active.  Periods : regular.  LMP about 2 weeks ago.  Birth control condoms.  Last pap smear date  11/2014 Negative.  Last mammogram date 08/2014.  Abnormal pap smear CIN 3 (2000) treated with LEEP.  STD 2.11.14 - TRICH, 11/2014 trich.  GYN procedures 1.21.16 - left breast bx.    OB History  Never been pregnant per patient.    Allergies  N.K.D.A.   Hospitalization/Major Diagnostic Procedure  stroke 2000   Review of Systems  CONSTITUTIONAL:  no Chills. no Fever. no Skin rash.  HEENT:  Blurrred vision no.  CARDIOLOGY:  no Chest pain.  RESPIRATORY:  no Shortness of breath. no Cough.  GASTROENTEROLOGY:  no Abdominal pain. no Appetite change. no Change in bowel movements.  UROLOGY:  no Urinary frequency. no Urinary incontinence. no Urinary urgency.  FEMALE REPRODUCTIVE:  no Breast lumps or discharge. no Breast pain. no Dysmenorrhea. no Dyspareunia.  NEUROLOGY:  no Dizziness. no Headache.     O: Examination performed in office on 06/24/15 Vital Signs  Wt 283, Wt change 1.6 lb, Ht 63.5, BMI 49.34, Pulse sitting 91, BP sitting 141/87.   Examination  General Examination: GENERAL APPEARANCE alert, oriented, NAD, pleasant.  SKIN: normal, no rash.  NECK: no lymphadenopathy.  LUNGS: clear to auscultation bilaterally, no wheezes, rhonchi, rales.  HEART: no murmurs, regular rate and rhythm.  ABDOMEN: obese, no masses palpated, soft and not tender.  MUSCULOSKELETAL no calf tenderness bilaterally.  EXTREMITIES: no edema present.     A/P: 44yo G0 who presents for laparoscopic tubal ligation -NPO -LR @ 125cc/hr -SCDs to OR -antibiotics not indicated  Bilateral tubal ligation reviewed with R&B including but not limited to bleeding, infection, injury to other organs, irreversibility and failure rate of 08/998. Discussed potential effect on her menses. Recovery reviewed, questions answered and pt wishes to proceed with procedure.   Myna Hidalgo, DO [phone] (pager) [phone] (office)

## 2015-06-29 ENCOUNTER — Ambulatory Visit (HOSPITAL_COMMUNITY): Payer: No Typology Code available for payment source | Admitting: Anesthesiology

## 2015-06-29 ENCOUNTER — Ambulatory Visit (HOSPITAL_COMMUNITY)
Admission: AD | Admit: 2015-06-29 | Discharge: 2015-06-29 | Disposition: A | Discharge status: Home / Self Care | Payer: No Typology Code available for payment source | Source: Ambulatory Visit | Attending: Obstetrics & Gynecology | Admitting: Obstetrics & Gynecology

## 2015-06-29 ENCOUNTER — Encounter (HOSPITAL_COMMUNITY)
Admission: AD | Disposition: A | Discharge status: Home / Self Care | Payer: Self-pay | Source: Ambulatory Visit | Attending: Obstetrics & Gynecology

## 2015-06-29 ENCOUNTER — Encounter (HOSPITAL_COMMUNITY): Payer: Self-pay | Admitting: Anesthesiology

## 2015-06-29 DIAGNOSIS — Z302 Encounter for sterilization: Secondary | ICD-10-CM | POA: Insufficient documentation

## 2015-06-29 DIAGNOSIS — Z8673 Personal history of transient ischemic attack (TIA), and cerebral infarction without residual deficits: Secondary | ICD-10-CM | POA: Diagnosis not present

## 2015-06-29 HISTORY — PX: LAPAROSCOPIC TUBAL LIGATION: SHX1937

## 2015-06-29 LAB — COMPREHENSIVE METABOLIC PANEL
ALBUMIN: 3.5 g/dL (ref 3.5–5.0)
ALK PHOS: 55 U/L (ref 38–126)
ALT: 19 U/L (ref 14–54)
ANION GAP: 6 (ref 5–15)
AST: 15 U/L (ref 15–41)
BILIRUBIN TOTAL: 0.3 mg/dL (ref 0.3–1.2)
BUN: 17 mg/dL (ref 6–20)
CALCIUM: 8.9 mg/dL (ref 8.9–10.3)
CO2: 29 mmol/L (ref 22–32)
Chloride: 104 mmol/L (ref 101–111)
Creatinine, Ser: 0.8 mg/dL (ref 0.44–1.00)
GFR calc non Af Amer: >60 mL/min (ref >60)
Glucose, Bld: 105 mg/dL — ABNORMAL HIGH (ref 65–99)
POTASSIUM: 3.6 mmol/L (ref 3.5–5.1)
SODIUM: 139 mmol/L (ref 135–145)
TOTAL PROTEIN: 7.9 g/dL (ref 6.5–8.1)

## 2015-06-29 LAB — CBC
HCT: 38 % (ref 36.0–46.0)
HEMOGLOBIN: 12.3 g/dL (ref 12.0–15.0)
MCH: 29.6 pg (ref 26.0–34.0)
MCHC: 32.4 g/dL (ref 30.0–36.0)
MCV: 91.6 fL (ref 78.0–100.0)
Platelets: 260 10*3/uL (ref 150–400)
RBC: 4.15 MIL/uL (ref 3.87–5.11)
RDW: 13 % (ref 11.5–15.5)
WBC: 8.1 10*3/uL (ref 4.0–10.5)

## 2015-06-29 LAB — PREGNANCY, URINE: Preg Test, Ur: NEGATIVE

## 2015-06-29 SURGERY — LIGATION, FALLOPIAN TUBE, LAPAROSCOPIC
Anesthesia: General | Site: Abdomen | Laterality: Bilateral

## 2015-06-29 MED ORDER — KETOROLAC TROMETHAMINE 30 MG/ML IJ SOLN
INTRAMUSCULAR | Status: DC | PRN
  Administered 2015-06-29: 30 mg via INTRAVENOUS

## 2015-06-29 MED ORDER — SCOPOLAMINE 1 MG/3DAYS TD PT72
1.0000 | MEDICATED_PATCH | Freq: Once | TRANSDERMAL | Status: DC
  Administered 2015-06-29: 1.5 mg via TRANSDERMAL

## 2015-06-29 MED ORDER — SODIUM CHLORIDE 0.9 % IJ SOLN
INTRAMUSCULAR | Status: AC
  Filled 2015-06-29: qty 10

## 2015-06-29 MED ORDER — PROPOFOL 10 MG/ML IV BOLUS
INTRAVENOUS | Status: DC | PRN
  Administered 2015-06-29: 200 mg via INTRAVENOUS

## 2015-06-29 MED ORDER — SUCCINYLCHOLINE CHLORIDE 200 MG/10ML IV SOSY
PREFILLED_SYRINGE | INTRAVENOUS | Status: DC | PRN
  Administered 2015-06-29: 20 mg via INTRAVENOUS
  Administered 2015-06-29: 160 mg via INTRAVENOUS
  Administered 2015-06-29: 20 mg via INTRAVENOUS

## 2015-06-29 MED ORDER — SUCCINYLCHOLINE CHLORIDE 20 MG/ML IJ SOLN
INTRAMUSCULAR | Status: AC
  Filled 2015-06-29: qty 1

## 2015-06-29 MED ORDER — DEXAMETHASONE SODIUM PHOSPHATE 4 MG/ML IJ SOLN
INTRAMUSCULAR | Status: AC
  Filled 2015-06-29: qty 1

## 2015-06-29 MED ORDER — ONDANSETRON HCL 4 MG/2ML IJ SOLN
INTRAMUSCULAR | Status: DC | PRN
  Administered 2015-06-29: 4 mg via INTRAVENOUS

## 2015-06-29 MED ORDER — MIDAZOLAM HCL 2 MG/2ML IJ SOLN
INTRAMUSCULAR | Status: AC
  Filled 2015-06-29: qty 2

## 2015-06-29 MED ORDER — LIDOCAINE HCL (CARDIAC) 20 MG/ML IV SOLN
INTRAVENOUS | Status: DC | PRN
  Administered 2015-06-29: 30 mg via INTRAVENOUS

## 2015-06-29 MED ORDER — MEPERIDINE HCL 25 MG/ML IJ SOLN: 6.2500 mg | INTRAMUSCULAR | Status: DC | PRN

## 2015-06-29 MED ORDER — HYDROCODONE-ACETAMINOPHEN 7.5-325 MG PO TABS
ORAL_TABLET | ORAL | Status: AC
  Filled 2015-06-29: qty 1

## 2015-06-29 MED ORDER — KETOROLAC TROMETHAMINE 30 MG/ML IJ SOLN
INTRAMUSCULAR | Status: AC
  Filled 2015-06-29: qty 1

## 2015-06-29 MED ORDER — DOCUSATE SODIUM 100 MG PO CAPS: 100.0000 mg | ORAL_CAPSULE | Freq: Two times a day (BID) | ORAL | Status: DC

## 2015-06-29 MED ORDER — BUPIVACAINE HCL (PF) 0.25 % IJ SOLN
INTRAMUSCULAR | Status: DC | PRN
  Administered 2015-06-29: 10 mL

## 2015-06-29 MED ORDER — GLYCOPYRROLATE 0.2 MG/ML IJ SOLN
INTRAMUSCULAR | Status: AC
  Filled 2015-06-29: qty 1

## 2015-06-29 MED ORDER — OXYCODONE-ACETAMINOPHEN 5-325 MG PO TABS: 1.0000 | ORAL_TABLET | ORAL | Status: DC | PRN

## 2015-06-29 MED ORDER — FENTANYL CITRATE (PF) 250 MCG/5ML IJ SOLN
INTRAMUSCULAR | Status: AC
  Filled 2015-06-29: qty 5

## 2015-06-29 MED ORDER — MIDAZOLAM HCL 2 MG/2ML IJ SOLN
INTRAMUSCULAR | Status: DC | PRN
  Administered 2015-06-29: 1 mg via INTRAVENOUS

## 2015-06-29 MED ORDER — IBUPROFEN 600 MG PO TABS: 600.0000 mg | ORAL_TABLET | Freq: Four times a day (QID) | ORAL | Status: DC | PRN

## 2015-06-29 MED ORDER — EPHEDRINE SULFATE 50 MG/ML IJ SOLN
INTRAMUSCULAR | Status: DC | PRN
  Administered 2015-06-29: 15 mg via INTRAVENOUS
  Administered 2015-06-29: 10 mg via INTRAVENOUS

## 2015-06-29 MED ORDER — BUPIVACAINE HCL (PF) 0.25 % IJ SOLN
INTRAMUSCULAR | Status: AC
  Filled 2015-06-29: qty 30

## 2015-06-29 MED ORDER — PROPOFOL 10 MG/ML IV BOLUS
INTRAVENOUS | Status: AC
  Filled 2015-06-29: qty 20

## 2015-06-29 MED ORDER — METOCLOPRAMIDE HCL 5 MG/ML IJ SOLN: 10.0000 mg | Freq: Once | INTRAMUSCULAR | Status: DC | PRN

## 2015-06-29 MED ORDER — ROCURONIUM BROMIDE 100 MG/10ML IV SOLN
INTRAVENOUS | Status: DC | PRN
  Administered 2015-06-29: 5 mg via INTRAVENOUS

## 2015-06-29 MED ORDER — DEXAMETHASONE SODIUM PHOSPHATE 10 MG/ML IJ SOLN
INTRAMUSCULAR | Status: DC | PRN
  Administered 2015-06-29: 4 mg via INTRAVENOUS

## 2015-06-29 MED ORDER — FENTANYL CITRATE (PF) 100 MCG/2ML IJ SOLN: 25.0000 ug | INTRAMUSCULAR | Status: DC | PRN

## 2015-06-29 MED ORDER — LACTATED RINGERS IV SOLN
INTRAVENOUS | Status: DC
  Administered 2015-06-29: 07:00:00 via INTRAVENOUS

## 2015-06-29 MED ORDER — HYDROCODONE-ACETAMINOPHEN 7.5-325 MG PO TABS
1.0000 | ORAL_TABLET | Freq: Once | ORAL | Status: AC | PRN
  Administered 2015-06-29: 1 via ORAL

## 2015-06-29 MED ORDER — LIDOCAINE HCL (CARDIAC) 20 MG/ML IV SOLN
INTRAVENOUS | Status: AC
  Filled 2015-06-29: qty 5

## 2015-06-29 MED ORDER — ONDANSETRON HCL 4 MG/2ML IJ SOLN
INTRAMUSCULAR | Status: AC
  Filled 2015-06-29: qty 2

## 2015-06-29 MED ORDER — GLYCOPYRROLATE 0.2 MG/ML IJ SOLN
INTRAMUSCULAR | Status: AC
  Filled 2015-06-29: qty 3

## 2015-06-29 MED ORDER — SCOPOLAMINE 1 MG/3DAYS TD PT72
MEDICATED_PATCH | TRANSDERMAL | Status: AC
  Filled 2015-06-29: qty 1

## 2015-06-29 MED ORDER — FENTANYL CITRATE (PF) 100 MCG/2ML IJ SOLN
INTRAMUSCULAR | Status: DC | PRN
  Administered 2015-06-29 (×2): 50 ug via INTRAVENOUS

## 2015-06-29 SURGICAL SUPPLY — 26 items
CATH ROBINSON RED A/P 16FR (CATHETERS) ×2 IMPLANT
CLOTH BEACON ORANGE TIMEOUT ST (SAFETY) ×2 IMPLANT
DRSG COVADERM PLUS 2X2 (GAUZE/BANDAGES/DRESSINGS) ×4 IMPLANT
DRSG OPSITE POSTOP 3X4 (GAUZE/BANDAGES/DRESSINGS) ×1 IMPLANT
DURAPREP 26ML APPLICATOR (WOUND CARE) ×2 IMPLANT
FORCEPS CUTTING 33CM 5MM (CUTTING FORCEPS) IMPLANT
GLOVE BIOGEL PI IND STRL 6.5 (GLOVE) ×2 IMPLANT
GLOVE BIOGEL PI IND STRL 7.0 (GLOVE) ×1 IMPLANT
GLOVE BIOGEL PI INDICATOR 6.5 (GLOVE) ×2
GLOVE BIOGEL PI INDICATOR 7.0 (GLOVE) ×1
GLOVE ECLIPSE 6.5 STRL STRAW (GLOVE) ×2 IMPLANT
GOWN STRL REUS W/TWL LRG LVL3 (GOWN DISPOSABLE) ×4 IMPLANT
LIQUID BAND (GAUZE/BANDAGES/DRESSINGS) ×2 IMPLANT
NEEDLE INSUFFLATION 120MM (ENDOMECHANICALS) IMPLANT
PACK LAPAROSCOPY BASIN (CUSTOM PROCEDURE TRAY) ×2 IMPLANT
PAD OB MATERNITY 4.3X12.25 (PERSONAL CARE ITEMS) ×2 IMPLANT
PAD POSITIONING PINK XL (MISCELLANEOUS) ×2 IMPLANT
SLEEVE XCEL OPT CAN 5 100 (ENDOMECHANICALS) ×1 IMPLANT
STRIP CLOSURE SKIN 1/4X4 (GAUZE/BANDAGES/DRESSINGS) ×1 IMPLANT
SUT MON AB 4-0 PS1 27 (SUTURE) ×2 IMPLANT
SUT VICRYL 0 UR6 27IN ABS (SUTURE) IMPLANT
TOWEL OR 17X24 6PK STRL BLUE (TOWEL DISPOSABLE) ×4 IMPLANT
TROCAR XCEL NON-BLD 11X100MML (ENDOMECHANICALS) ×2 IMPLANT
TROCAR XCEL NON-BLD 5MMX100MML (ENDOMECHANICALS) IMPLANT
WARMER LAPAROSCOPE (MISCELLANEOUS) ×2 IMPLANT
WATER STERILE IRR 1000ML POUR (IV SOLUTION) ×2 IMPLANT

## 2015-06-29 NOTE — Anesthesia Preprocedure Evaluation (Addendum)
Anesthesia Evaluation  Patient identified by MRN, date of birth, ID band Patient awake    Reviewed: Allergy & Precautions, NPO status , Patient's Chart, lab work & pertinent test results  Airway Mallampati: III  TM Distance: >3 FB Neck ROM: Full    Dental no notable dental hx. (+) Teeth Intact   Pulmonary sleep apnea ,    Pulmonary exam normal breath sounds clear to auscultation       Cardiovascular negative cardio ROS Normal cardiovascular exam Rhythm:Regular Rate:Normal     Neuro/Psych negative neurological ROS  negative psych ROS   GI/Hepatic GERD  Medicated and Controlled,IBS   Endo/Other  Morbid obesity  Renal/GU    Hx/o bladder calculus negative genitourinary   Musculoskeletal negative musculoskeletal ROS (+)   Abdominal (+) + obese,   Peds  Hematology negative hematology ROS (+)   Anesthesia Other Findings   Reproductive/Obstetrics Desires Sterilization                            Anesthesia Physical Anesthesia Plan  ASA: III  Anesthesia Plan: General   Post-op Pain Management:    Induction: Intravenous  Airway Management Planned: Oral ETT  Additional Equipment:   Intra-op Plan:   Post-operative Plan: Extubation in OR  Informed Consent: I have reviewed the patients History and Physical, chart, labs and discussed the procedure including the risks, benefits and alternatives for the proposed anesthesia with the patient or authorized representative who has indicated his/her understanding and acceptance.   Dental advisory given  Plan Discussed with: Anesthesiologist, CRNA and Surgeon  Anesthesia Plan Comments:         Anesthesia Quick Evaluation

## 2015-06-29 NOTE — Anesthesia Procedure Notes (Signed)
Procedure Name: Intubation Date/Time: 06/29/2015 7:24 AM Performed by: Suella GroveMOORE, Teigen Bellin C Pre-anesthesia Checklist: Patient identified, Emergency Drugs available, Suction available, Patient being monitored and Timeout performed Patient Re-evaluated:Patient Re-evaluated prior to inductionOxygen Delivery Method: Simple face mask and Circle system utilized Intubation Type: IV induction and Rapid sequence Laryngoscope Size: Mac and 3 Grade View: Grade III Tube size: 7.0 mm Number of attempts: 1 Airway Equipment and Method: Stylet Placement Confirmation: ETT inserted through vocal cords under direct vision,  positive ETCO2 and breath sounds checked- equal and bilateral Secured at: 21 cm Tube secured with: Tape Dental Injury: Teeth and Oropharynx as per pre-operative assessment  Future Recommendations: Recommend- induction with short-acting agent, and alternative techniques readily available

## 2015-06-29 NOTE — Anesthesia Postprocedure Evaluation (Signed)
Anesthesia Post Note  Patient: Christine Bernard  Procedure(s) Performed: Procedure(s) (LRB): LAPAROSCOPIC BILATERAL TUBAL LIGATION (Bilateral)  Patient location during evaluation: PACU Anesthesia Type: General Level of consciousness: awake and alert and oriented Pain management: pain level controlled Respiratory status: spontaneous breathing and nonlabored ventilation Cardiovascular status: blood pressure returned to baseline and stable Anesthetic complications: no    Last Vitals:  Filed Vitals:   06/29/15 0945 06/29/15 1000  BP: 142/90 135/88  Pulse: 76 79  Temp:  37 C  Resp: 18 20    Last Pain:  Filed Vitals:   06/29/15 1005  PainSc: 3                  Harneet Noblett A.

## 2015-06-29 NOTE — Op Note (Signed)
PreOp Diagnosis: 1) Desires permanent sterilization PostOp Diagnosis: same Procedure: Bilateral tubal ligation Surgeon: Dr. Myna HidalgoJennifer Ruhi Kopke Anesthesia: general Complications: none EBL: 5cc UOP: 100cc  Procedure: The patient was taken to the operating room where she was placed under general anesthesia. She is placed in the dorsolithotomy position. She was prepped and draped in the usual sterile fashion. Speculum was placed into the vaginal vault. In the anterior lip of the cervix grasped with a single-tooth tenaculum. A uterine manipulator was placed without difficulty. The single-tooth tenaculum was removed. The speculum was removed.  Attention was turned to the umbilicus which was injected with 10 cc of quarter percent Marcaine. A 11 mm incision was made with the scalpel. The Veress needle was introduced and saline drop test performed. Opening pressure was 15mmHg. The Veress needle was removed and direct placement of the laparascope was performed under direct visualization.  The pneumoperitoneum was achieved with CO2 gas. The abdomen was inspected- normal appearing bowel and liver. Appendix not seen. Uterus, bilateral fallopian tubes and ovaries appeared normal.  Operative scope was inserted and the right fallopian tube was identified and followed out to the fimbriated end. The a 2-3 cm portion of the right fallopian tube was cauterized with the Kleppinger. This was repeated on the left fallopian tube in a similar fashion; however there was some difficulty with full visualization of the fimbrae due to adhesions. Pneumoperitoneum was released. The umbilical trocar was removed under direct visualization. The fascia was closed with 0 Vicryl. The skin was closed with 4-0 Vicryl. Attention was turned vaginally, the Hulka manipulator was removed, hemostasis was noted. Patient was awakened from anesthesia taken to the recovery room awake and in stable condition.  Sponge, lap and needle counts were  correct and the end of the procedure.  Myna HidalgoJennifer Salem Lembke, DO [phone] (pager) [phone] (office)

## 2015-06-29 NOTE — Interval H&P Note (Signed)
History and Physical Interval Note:  06/29/2015 6:29 AM  Christine RecordsPatricia F Bernard  has presented today for surgery, with the diagnosis of Z30.9 Contraceptive Management  The various methods of treatment have been discussed with the patient and family. After consideration of risks, benefits and other options for treatment, the patient has consented to  Procedure(s): LAPAROSCOPIC BILATERAL TUBAL LIGATION (Bilateral) as a surgical intervention .  The patient's history has been reviewed, patient examined, no change in status, stable for surgery.  I have reviewed the patient's chart and labs.  Questions were answered to the patient's satisfaction.     Myna HidalgoZAN, [place] Lesure, M

## 2015-06-29 NOTE — Discharge Instructions (Signed)
Laparoscopic Tubal Ligation, Care After Refer to this sheet in the next few weeks. These instructions provide you with information about caring for yourself after your procedure. Your health care provider may also give you more specific instructions. Your treatment has been planned according to current medical practices, but problems sometimes occur. Call your health care provider if you have any problems or questions after your procedure. WHAT TO EXPECT AFTER THE PROCEDURE After your procedure, it is common to have:  Sore throat.  Soreness at the incision site.  Mild cramping.  Tiredness.  Mild nausea or vomiting.  Shoulder pain. HOME CARE INSTRUCTIONS  Rest for the remainder of the day.  Take medicines only as directed by your health care provider.   You may alternate between Motrin and Percocet as needed for pain.  Percocet may cause constipation so please take colace (stool softener) twice daily as needed to help.These include over-the-counter medicines and prescription medicines. Do not take aspirin, which can cause bleeding.  Over the next few days, gradually return to your normal activities and your normal diet.  Avoid sexual intercourse for 2 weeks or as directed by your health care provider.  Do not use tampons, and do not douche.  Do not drive or operate heavy machinery while taking pain medicine.  Do not lift anything that is heavier than 5 lb (2.3 kg) for 2 weeks or as directed by your health care provider.  Do not take baths. Take showers only. Ask your health care provider when you can start taking baths.  Take your temperature twice each day and write it down.  Try to have help for your household needs for the first 7-10 days.  There are many different ways to close and cover an incision, including stitches (sutures), skin glue, and adhesive strips. Follow instructions from your health care provider about:  Incision care.  Bandage (dressing) changes and  removal.  Incision closure removal.  Check your incision area every day for signs of infection. Watch for:  Redness, swelling, or pain.  Fluid, blood, or pus.  Keep all follow-up visits as directed by your health care provider. SEEK MEDICAL CARE IF:  You have redness, swelling, or increasing pain in your incision area.  You have fluid, blood, or pus coming from your incision for longer than 1 day.  You notice a bad smell coming from your incision or your dressing.  The edges of your incision break open after the sutures have been removed.  Your pain does not decrease after 2-3 days.  You have a rash.  You repeatedly become dizzy or light-headed.  You have a reaction to your medicine.  Your pain medicine is not helping.  You are constipated. SEEK IMMEDIATE MEDICAL CARE IF:  You have a fever.  You faint.  You have increasing pain in your abdomen.  You have severe pain in one or both of your shoulders.  You have bleeding or drainage from your suture sites or your vagina after surgery.  You have shortness of breath or have difficulty breathing.  You have chest pain or leg pain.  You have ongoing nausea, vomiting, or diarrhea.   This information is not intended to replace advice given to you by your health care provider. Make sure you discuss any questions you have with your health care provider.   Document Released: 02/09/2005 Document Revised: 12/07/2014 Document Reviewed: 11/03/2011 Elsevier Interactive Patient Education [employer] Inc.

## 2015-06-29 NOTE — Transfer of Care (Signed)
Immediate Anesthesia Transfer of Care Note  Patient: Christine Bernard  Procedure(s) Performed: Procedure(s): LAPAROSCOPIC BILATERAL TUBAL LIGATION (Bilateral)  Patient Location: PACU  Anesthesia Type:General  Level of Consciousness: awake, sedated and patient cooperative  Airway & Oxygen Therapy: Patient Spontanous Breathing and Patient connected to nasal cannula oxygen  Post-op Assessment: Report given to RN and Post -op Vital signs reviewed and stable  Post vital signs: Reviewed and stable  Last Vitals:  Filed Vitals:   06/29/15 0615 06/29/15 0650  BP: 145/102 132/98  Pulse: 87   Temp: 36.8 C     Complications: No apparent anesthesia complications

## 2015-07-04 ENCOUNTER — Encounter (HOSPITAL_COMMUNITY): Payer: Self-pay | Admitting: Obstetrics & Gynecology

## 2017-01-13 ENCOUNTER — Emergency Department (HOSPITAL_COMMUNITY)
Admission: EM | Admit: 2017-01-13 | Discharge: 2017-01-13 | Disposition: A | Discharge status: Home / Self Care | Payer: No Typology Code available for payment source | Attending: Emergency Medicine | Admitting: Emergency Medicine

## 2017-01-13 ENCOUNTER — Encounter (HOSPITAL_COMMUNITY): Payer: Self-pay | Admitting: Emergency Medicine

## 2017-01-13 DIAGNOSIS — Z79899 Other long term (current) drug therapy: Secondary | ICD-10-CM | POA: Insufficient documentation

## 2017-01-13 DIAGNOSIS — N946 Dysmenorrhea, unspecified: Secondary | ICD-10-CM

## 2017-01-13 DIAGNOSIS — Z8673 Personal history of transient ischemic attack (TIA), and cerebral infarction without residual deficits: Secondary | ICD-10-CM | POA: Insufficient documentation

## 2017-01-13 DIAGNOSIS — N920 Excessive and frequent menstruation with regular cycle: Secondary | ICD-10-CM | POA: Insufficient documentation

## 2017-01-13 LAB — COMPREHENSIVE METABOLIC PANEL
ALBUMIN: 4.3 g/dL (ref 3.5–5.0)
ALT: 26 U/L (ref 14–54)
AST: 22 U/L (ref 15–41)
Alkaline Phosphatase: 53 U/L (ref 38–126)
Anion gap: 9 (ref 5–15)
BILIRUBIN TOTAL: 0.6 mg/dL (ref 0.3–1.2)
BUN: 11 mg/dL (ref 6–20)
CO2: 24 mmol/L (ref 22–32)
CREATININE: 0.67 mg/dL (ref 0.44–1.00)
Calcium: 9 mg/dL (ref 8.9–10.3)
Chloride: 103 mmol/L (ref 101–111)
GFR calc Af Amer: >60 mL/min (ref >60)
GLUCOSE: 114 mg/dL — AB (ref 65–99)
POTASSIUM: 4 mmol/L (ref 3.5–5.1)
Sodium: 136 mmol/L (ref 135–145)
TOTAL PROTEIN: 8.7 g/dL — AB (ref 6.5–8.1)

## 2017-01-13 LAB — PREGNANCY, URINE: PREG TEST UR: NEGATIVE

## 2017-01-13 LAB — URINALYSIS, ROUTINE W REFLEX MICROSCOPIC
BACTERIA UA: NONE SEEN
Bilirubin Urine: NEGATIVE
Glucose, UA: NEGATIVE mg/dL
Ketones, ur: 80 mg/dL — AB
Leukocytes, UA: NEGATIVE
Nitrite: NEGATIVE
Protein, ur: 30 mg/dL — AB
SPECIFIC GRAVITY, URINE: 1.019 (ref 1.005–1.030)
pH: 7 (ref 5.0–8.0)

## 2017-01-13 LAB — CBC WITH DIFFERENTIAL/PLATELET
BASOS PCT: 0 %
Basophils Absolute: 0 10*3/uL (ref 0.0–0.1)
EOS PCT: 0 %
Eosinophils Absolute: 0 10*3/uL (ref 0.0–0.7)
HEMATOCRIT: 39.2 % (ref 36.0–46.0)
Hemoglobin: 13.2 g/dL (ref 12.0–15.0)
LYMPHS PCT: 5 %
Lymphs Abs: 0.7 10*3/uL (ref 0.7–4.0)
MCH: 29.6 pg (ref 26.0–34.0)
MCHC: 33.7 g/dL (ref 30.0–36.0)
MCV: 87.9 fL (ref 78.0–100.0)
MONO ABS: 0.3 10*3/uL (ref 0.1–1.0)
MONOS PCT: 2 %
Neutro Abs: 12.9 10*3/uL — ABNORMAL HIGH (ref 1.7–7.7)
Neutrophils Relative %: 93 %
PLATELETS: 283 10*3/uL (ref 150–400)
RBC: 4.46 MIL/uL (ref 3.87–5.11)
RDW: 13.3 % (ref 11.5–15.5)
WBC: 13.9 10*3/uL — ABNORMAL HIGH (ref 4.0–10.5)

## 2017-01-13 LAB — LIPASE, BLOOD: Lipase: 21 U/L (ref 11–51)

## 2017-01-13 MED ORDER — IBUPROFEN 600 MG PO TABS: 600.0000 mg | ORAL_TABLET | Freq: Four times a day (QID) | ORAL | 0 refills | Status: DC | PRN

## 2017-01-13 MED ORDER — PROMETHAZINE HCL 25 MG PO TABS: 25.0000 mg | ORAL_TABLET | Freq: Four times a day (QID) | ORAL | 0 refills | Status: DC | PRN

## 2017-01-13 MED ORDER — KETOROLAC TROMETHAMINE 30 MG/ML IJ SOLN
30.0000 mg | Freq: Once | INTRAMUSCULAR | Status: AC
  Administered 2017-01-13: 30 mg via INTRAVENOUS
  Filled 2017-01-13: qty 1

## 2017-01-13 MED ORDER — ONDANSETRON HCL 4 MG/2ML IJ SOLN
4.0000 mg | Freq: Once | INTRAMUSCULAR | Status: AC
  Administered 2017-01-13: 4 mg via INTRAVENOUS
  Filled 2017-01-13: qty 2

## 2017-01-13 MED ORDER — MORPHINE SULFATE (PF) 2 MG/ML IV SOLN
4.0000 mg | Freq: Once | INTRAVENOUS | Status: AC
  Administered 2017-01-13: 4 mg via INTRAVENOUS
  Filled 2017-01-13: qty 2

## 2017-01-13 MED ORDER — SODIUM CHLORIDE 0.9 % IV BOLUS (SEPSIS)
1000.0000 mL | Freq: Once | INTRAVENOUS | Status: AC
  Administered 2017-01-13: 1000 mL via INTRAVENOUS

## 2017-01-13 NOTE — ED Notes (Signed)
Patient given water

## 2017-01-13 NOTE — ED Triage Notes (Signed)
Pt from home c/o cramping in lower abdomen after starting her cycle on Friday. Pt reports pain that causes nausea. Pt took Tylenol but was unable to keep it down. Pt is A&O and in NAD.

## 2017-01-13 NOTE — ED Provider Notes (Signed)
[facility]-EMERGENCY DEPT Provider Note   CSN: [id number] Arrival date & time: 01/13/17  1600     History   Chief Complaint Chief Complaint  Patient presents with  . Abdominal Cramping    HPI Christine Bernard is a 46 y.o. female.  HPI   46 year old female with history of IBS, GERD, for urination with surgical history is significant for cholecystectomy and tubal ligation presented with complaints of abdominal pain. Patient is currently on her third day of her menstrual period. Today her menstrual cramping is more intense than usual. She reports 10 out of 10 persistent abdominal pain with associate nausea, has vomited multiple times of nonbloody nonbilious content. Now she is dry heaving. Leaning over seems to improve the pain laying flat the pain worse. She felt pain is typical of her menstrual cramping. She denies any fever, chills, chest pain, terms of breath, productive cough, dysuria, hematuria, vaginal discharge,, or rash. She tries taking Tylenol earlier today but vomited it up.  Past Medical History:  Diagnosis Date  . Frequent urination   . GERD (gastroesophageal reflux disease)   . IBS (irritable bowel syndrome)   . Sleep apnea    Does not use CPAP diagnosed ~ 10 years  . Stroke [facility]) 2002   No residual effects thought to be secondary to Depo Provera use    Patient Active Problem List   Diagnosis Date Noted  . Chronic cholecystitis with calculus 10/20/2012    Past Surgical History:  Procedure Laterality Date  . CHOLECYSTECTOMY N/A 11/07/2012   Procedure: LAPAROSCOPIC CHOLECYSTECTOMY WITH INTRAOPERATIVE CHOLANGIOGRAM;  Surgeon: Wilmon ArmsMatthew K. Corliss Skainssuei, MD;  Location: [facility] OR;  Service: General;  Laterality: N/A;  . LAPAROSCOPIC TUBAL LIGATION Bilateral 06/29/2015   Procedure: LAPAROSCOPIC BILATERAL TUBAL LIGATION;  Surgeon: Myna HidalgoJennifer Ozan, DO;  Location: [facility] ORS;  Service: Gynecology;  Laterality: Bilateral;  . LEEP  2000    OB History    No data available       Home  Medications    Prior to Admission medications   Medication Sig Start Date End Date Taking? Authorizing Provider  docusate sodium (COLACE) 100 MG capsule Take 1 capsule (100 mg total) by mouth 2 (two) times daily. 06/29/15   Myna Hidalgozan, Jennifer, DO  ibuprofen (ADVIL,MOTRIN) 600 MG tablet Take 1 tablet (600 mg total) by mouth every 6 (six) hours as needed. 06/29/15   Myna Hidalgozan, Jennifer, DO  oxybutynin (OXYTROL) 3.9 MG/24HR Place 1 patch onto the skin See admin instructions. Every 4 days    [provider]  oxyCODONE-acetaminophen (ROXICET) 5-325 MG tablet Take 1 tablet by mouth every 4 (four) hours as needed for severe pain. 06/29/15   Myna Hidalgozan, Jennifer, DO  Vitamin D, Ergocalciferol, (DRISDOL) 50000 UNITS CAPS capsule Take 50,000 Units by mouth 2 (two) times a week.    [provider]    Family History No family history on file.  Social History Social History  Substance Use Topics  . Smoking status: Never Smoker  . Smokeless tobacco: Never Used  . Alcohol use No     Comment: Rare     Allergies   Patient has no known allergies.   Review of Systems Review of Systems  All other systems reviewed and are negative.    Physical Exam Updated Vital Signs BP (!) 169/134 (BP Location: Left Arm)   Pulse 77   Temp 98.6 F (37 C) (Oral)   Resp 20   LMP 01/11/2017 (Exact Date)   SpO2 100%   Physical Exam  Constitutional:  She appears well-developed and well-nourished. No distress.  Moderately obese female, leaning over the bed, appears uncomfortable and dry heaving.  HENT:  Head: Atraumatic.  Eyes: Conjunctivae are normal.  Neck: Neck supple.  Cardiovascular: Normal rate and regular rhythm.   Pulmonary/Chest: Effort normal and breath sounds normal.  Abdominal: Soft. Bowel sounds are normal. She exhibits no distension. There is tenderness (Diffuse abdominal tenderness most significant to suprapubic region without guarding or rebound tenderness.).  No CVA tenderness    Neurological: She is alert.  Skin: No rash noted.  Psychiatric: She has a normal mood and affect.  Nursing note and vitals reviewed.    ED Treatments / Results  Labs (all labs ordered are listed, but only abnormal results are displayed) Labs Reviewed  COMPREHENSIVE METABOLIC PANEL - Abnormal; Notable for the following:       Result Value   Glucose, Bld 114 (*)    Total Protein 8.7 (*)    All other components within normal limits  CBC WITH DIFFERENTIAL/PLATELET - Abnormal; Notable for the following:    WBC 13.9 (*)    Neutro Abs 12.9 (*)    All other components within normal limits  URINALYSIS, ROUTINE W REFLEX MICROSCOPIC - Abnormal; Notable for the following:    Hgb urine dipstick LARGE (*)    Ketones, ur 80 (*)    Protein, ur 30 (*)    Squamous Epithelial / LPF 0-5 (*)    All other components within normal limits  LIPASE, BLOOD  PREGNANCY, URINE    EKG  EKG Interpretation None       Radiology No results found.  Procedures Procedures (including critical care time)  Medications Ordered in ED Medications  sodium chloride 0.9 % bolus 1,000 mL (0 mLs Intravenous Stopped 01/13/17 1913)  ondansetron (ZOFRAN) injection 4 mg (4 mg Intravenous Given 01/13/17 1729)  morphine 2 MG/ML injection 4 mg (4 mg Intravenous Given 01/13/17 1730)  ketorolac (TORADOL) 30 MG/ML injection 30 mg (30 mg Intravenous Given 01/13/17 1809)     Initial Impression / Assessment and Plan / ED Course  I have reviewed the triage vital signs and the nursing notes.  Pertinent labs & imaging results that were available during my care of the patient were reviewed by me and considered in my medical decision making (see chart for details).     BP (!) 165/90   Pulse 81   Temp 98.6 F (37 C) (Oral)   Resp (!) 22   LMP 01/11/2017 (Exact Date)   SpO2 100%    Final Clinical Impressions(s) / ED Diagnoses   Final diagnoses:  Menorrhalgia    New Prescriptions New Prescriptions   PROMETHAZINE  (PHENERGAN) 25 MG TABLET    Take 1 tablet (25 mg total) by mouth every 6 (six) hours as needed for nausea.   5:02 PM Patient here with menorrhagia. She is also dry heaving but attributed to her pain. She does have diffuse abdominal tenderness without peritoneal sign. Will provide symptomatic treatment. May consider pelvic examination to rule out pelvic pathology.  6:02 PM Recent test is negative. Urine shows large amounts of hemoglobin on urine dipsticks: This is likely from her current menstruation. She has a ketones, she is receiving IV fluid. She has an elevated white count of 13.9. I discussed the options of performing a pelvic examination however patient states that she had a pelvic examination 2 weeks ago with normal results and does not want an additional exam. States she has not been sexually active  since. She felt that this is related to her menstruation. We'll continue with symptomatic treatment.  7:16 PM Patient felt much better after receiving treatment. On reexamination her abdominal exam is benign. She feels comfortable going home. Suspect menorrhalgia causing her symptoms. Low suspicion for acute abdominal pathology such as appendicitis, ovarian torsion, or other acute abdominal etiology. Return precaution discussed.   Fayrene Helper, PA-C 01/13/17 1917    Nira Conn, MD 01/14/17 [id number]

## 2017-04-10 ENCOUNTER — Other Ambulatory Visit: Payer: Self-pay | Admitting: Obstetrics & Gynecology

## 2017-04-10 DIAGNOSIS — Z1231 Encounter for screening mammogram for malignant neoplasm of breast: Secondary | ICD-10-CM

## 2017-04-18 ENCOUNTER — Ambulatory Visit
Admission: RE | Admit: 2017-04-18 | Discharge: 2017-04-18 | Disposition: A | Discharge status: Home / Self Care | Payer: No Typology Code available for payment source | Source: Ambulatory Visit | Attending: Obstetrics & Gynecology | Admitting: Obstetrics & Gynecology

## 2017-04-18 DIAGNOSIS — Z1231 Encounter for screening mammogram for malignant neoplasm of breast: Secondary | ICD-10-CM

## 2017-12-24 ENCOUNTER — Other Ambulatory Visit: Payer: Self-pay | Admitting: Nurse Practitioner

## 2017-12-24 ENCOUNTER — Other Ambulatory Visit (HOSPITAL_COMMUNITY)
Admission: RE | Admit: 2017-12-24 | Discharge: 2017-12-24 | Disposition: A | Discharge status: Home / Self Care | Payer: BLUE CROSS/BLUE SHIELD | Source: Ambulatory Visit | Attending: Nurse Practitioner | Admitting: Nurse Practitioner

## 2017-12-24 DIAGNOSIS — Z01419 Encounter for gynecological examination (general) (routine) without abnormal findings: Secondary | ICD-10-CM | POA: Diagnosis present

## 2017-12-25 LAB — CYTOLOGY - PAP
CHLAMYDIA, DNA PROBE: NEGATIVE
Diagnosis: NEGATIVE
HPV (WINDOPATH): NOT DETECTED
NEISSERIA GONORRHEA: NEGATIVE

## 2018-03-27 ENCOUNTER — Other Ambulatory Visit: Payer: Self-pay | Admitting: Obstetrics & Gynecology

## 2018-03-27 DIAGNOSIS — Z1231 Encounter for screening mammogram for malignant neoplasm of breast: Secondary | ICD-10-CM

## 2018-04-24 ENCOUNTER — Ambulatory Visit
Admission: RE | Admit: 2018-04-24 | Discharge: 2018-04-24 | Disposition: A | Discharge status: Home / Self Care | Payer: BLUE CROSS/BLUE SHIELD | Source: Ambulatory Visit | Attending: Obstetrics & Gynecology | Admitting: Obstetrics & Gynecology

## 2018-04-24 DIAGNOSIS — Z1231 Encounter for screening mammogram for malignant neoplasm of breast: Secondary | ICD-10-CM

## 2019-05-17 IMAGING — MG DIGITAL SCREENING BILATERAL MAMMOGRAM WITH TOMO AND CAD
8 series · 8 of 24 positions shown · non-contrast
Comparison: Previous exam(s).

CLINICAL DATA: Screening.

EXAM:
DIGITAL SCREENING BILATERAL MAMMOGRAM WITH TOMO AND CAD

[L CC synth-2D]
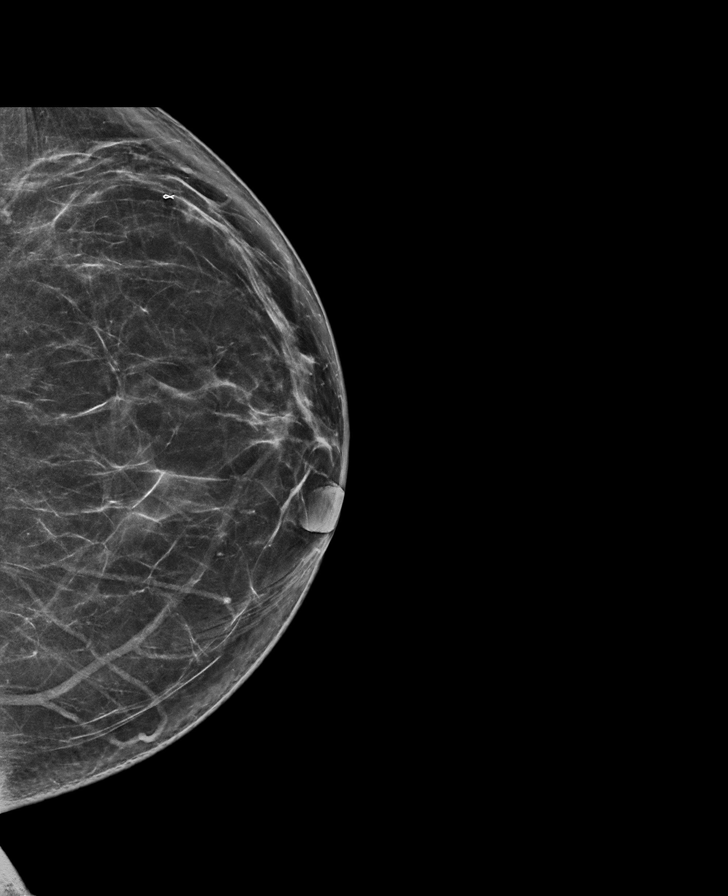

[L MLO synth-2D]
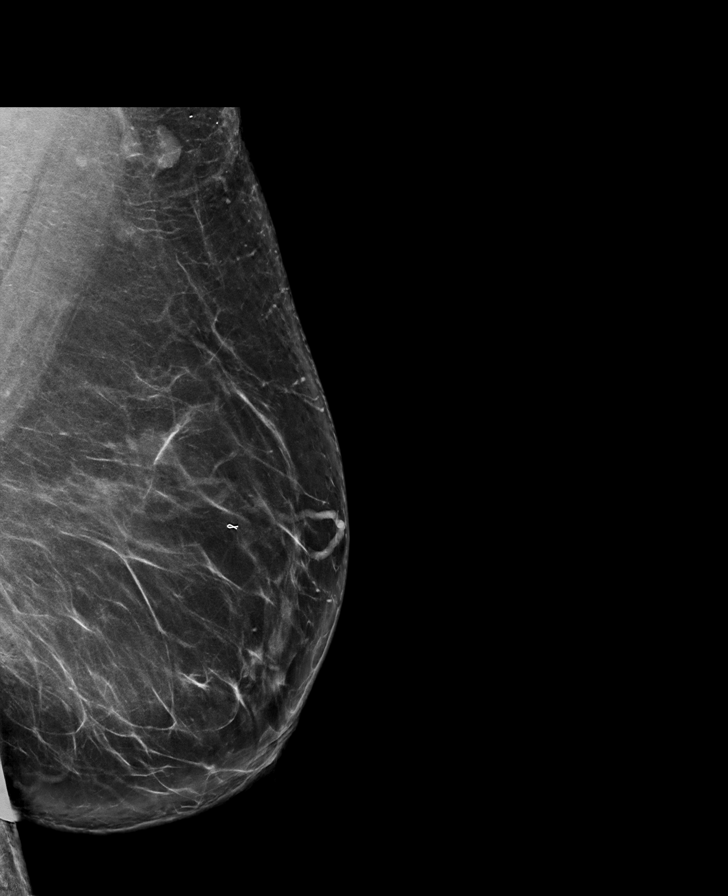

[R MLO synth-2D]
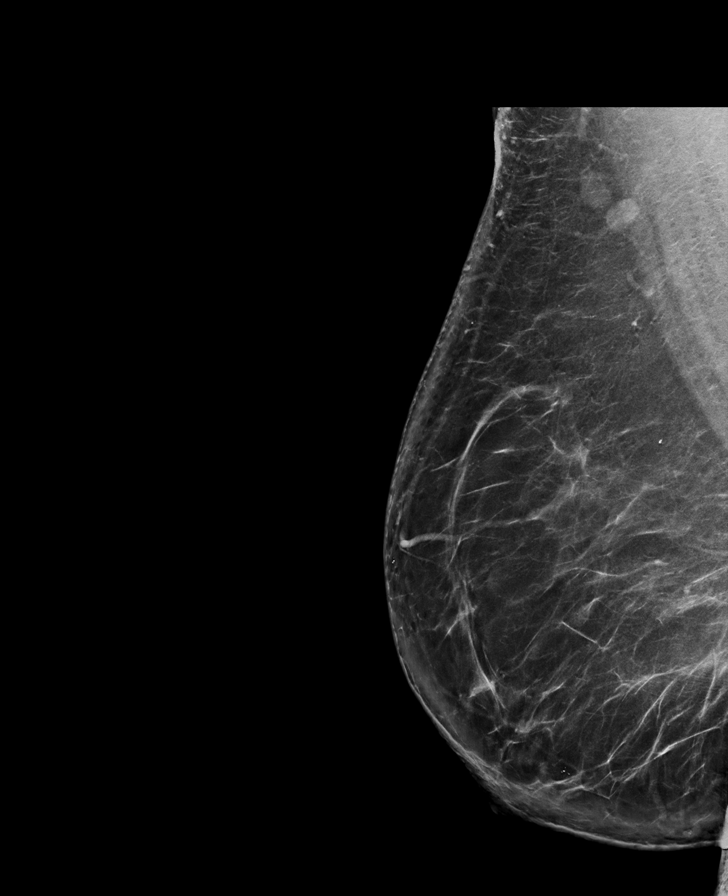

[R CC synth-2D]
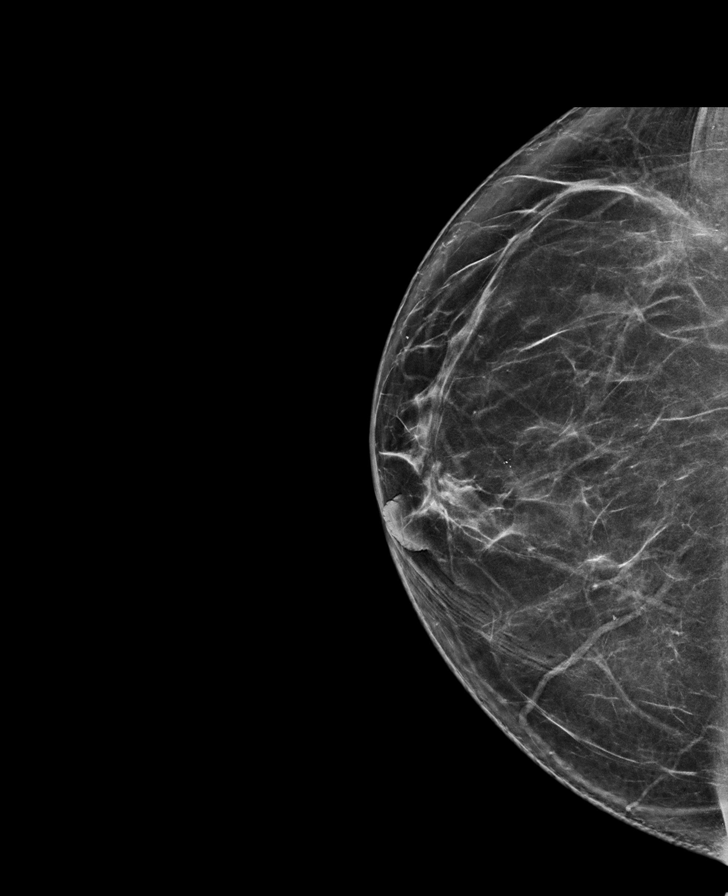

[L CC tomo · tomo slice 40/79.0]
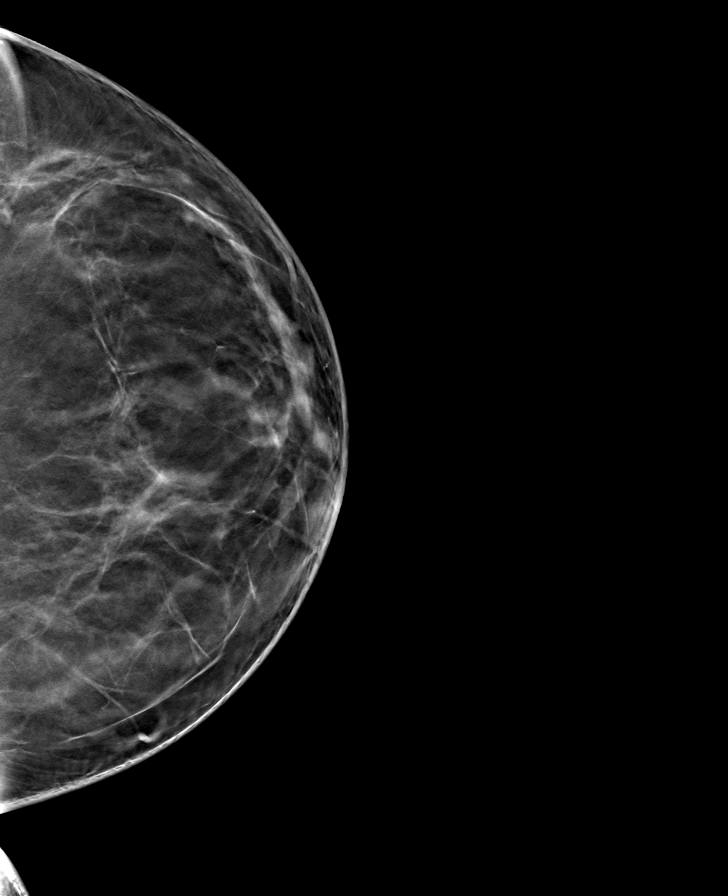

[L MLO tomo · tomo slice 51/101.0]
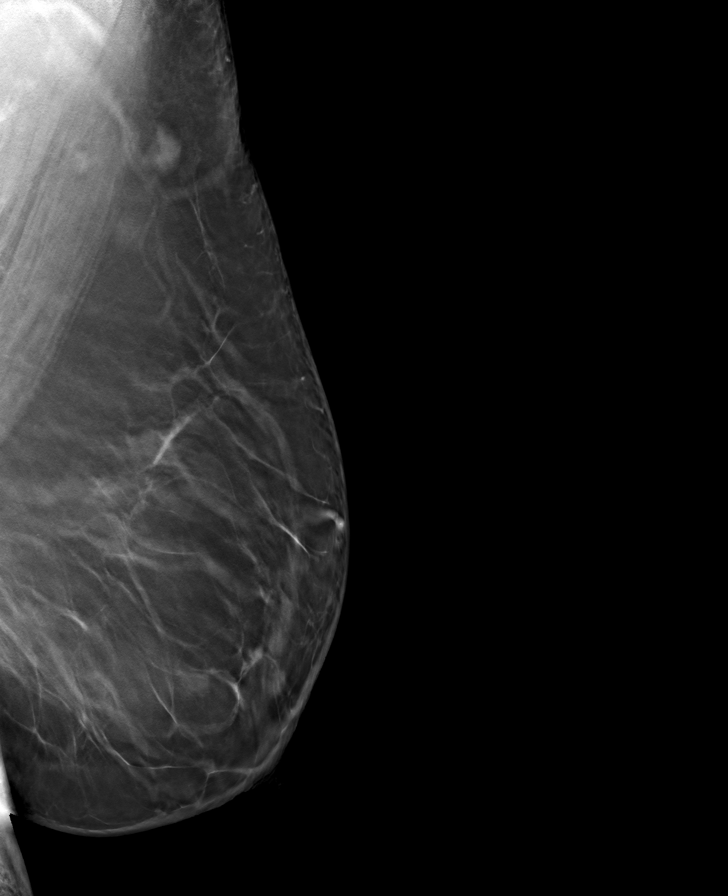

[R CC tomo · tomo slice 41/81.0]
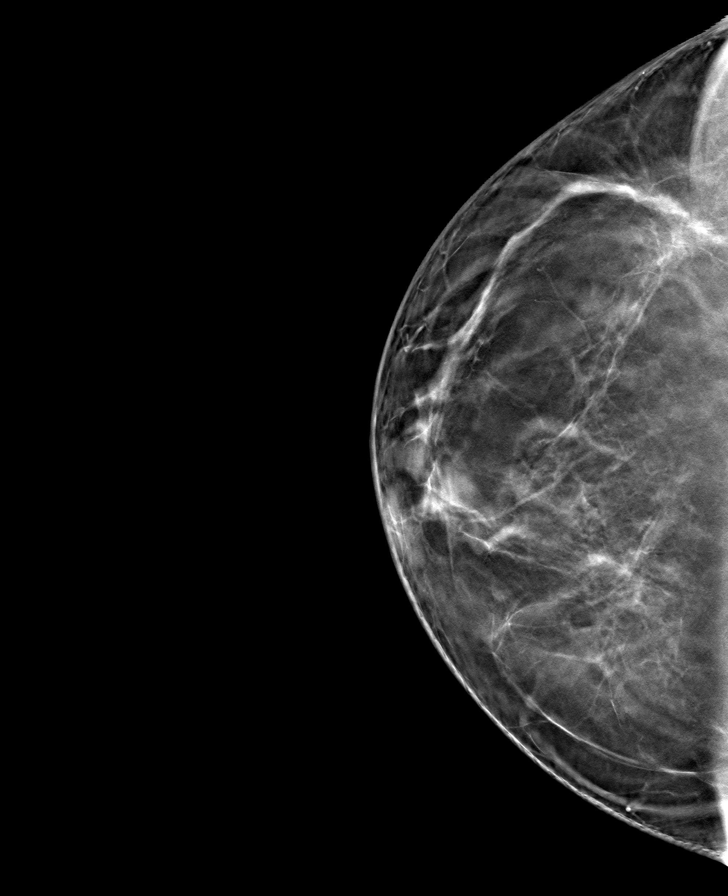

[R MLO tomo · tomo slice 51/100.0]
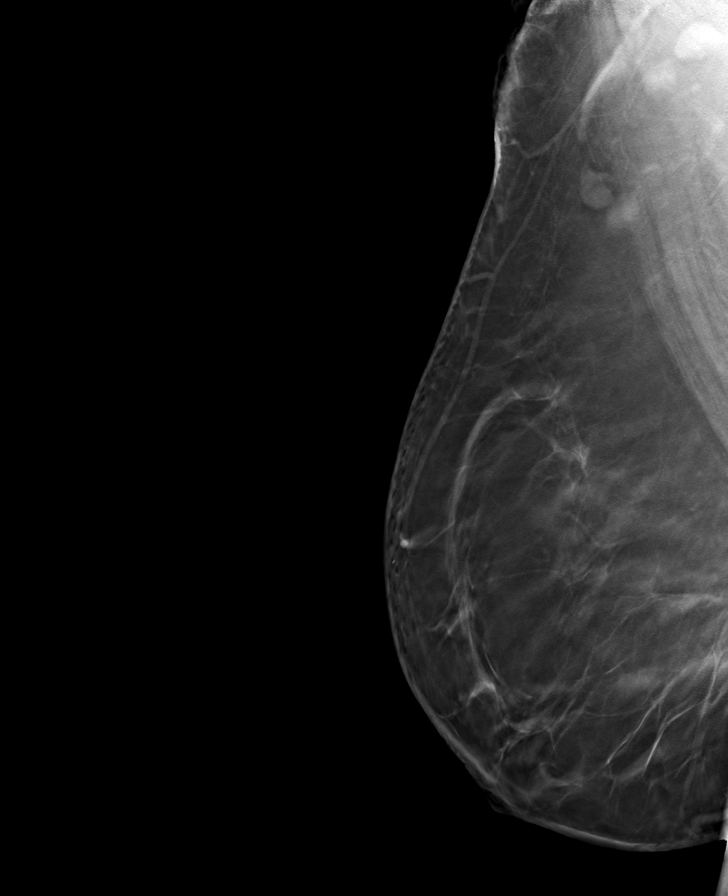

[8 of 24 positions shown; findings below may reference images not displayed]

ACR Breast Density Category b: There are scattered areas of
fibroglandular density.
FINDINGS: There are no findings suspicious for malignancy. Images were
processed with CAD.
IMPRESSION: No mammographic evidence of malignancy. A result letter of this
screening mammogram will be mailed directly to the patient.

RECOMMENDATION:
Screening mammogram in one year. (Code:[id number])

BI-RADS CATEGORY  1: Negative.

## 2019-12-29 ENCOUNTER — Other Ambulatory Visit: Payer: Self-pay

## 2019-12-29 ENCOUNTER — Encounter: Payer: Self-pay | Admitting: Sports Medicine

## 2019-12-29 ENCOUNTER — Ambulatory Visit (INDEPENDENT_AMBULATORY_CARE_PROVIDER_SITE_OTHER): Payer: 59 | Admitting: Sports Medicine

## 2019-12-29 VITALS — Temp 97.3°F

## 2019-12-29 DIAGNOSIS — M79671 Pain in right foot: Secondary | ICD-10-CM

## 2019-12-29 DIAGNOSIS — M79672 Pain in left foot: Secondary | ICD-10-CM

## 2019-12-29 DIAGNOSIS — B351 Tinea unguium: Secondary | ICD-10-CM | POA: Diagnosis not present

## 2019-12-29 DIAGNOSIS — B359 Dermatophytosis, unspecified: Secondary | ICD-10-CM | POA: Diagnosis not present

## 2019-12-29 MED ORDER — NYSTATIN-TRIAMCINOLONE 100000-0.1 UNIT/GM-% EX OINT: 1.0000 "application " | TOPICAL_OINTMENT | Freq: Two times a day (BID) | CUTANEOUS | 0 refills | Status: DC

## 2019-12-29 MED ORDER — TERBINAFINE HCL 250 MG PO TABS: 250.0000 mg | ORAL_TABLET | Freq: Every day | ORAL | 0 refills | Status: DC

## 2019-12-29 MED ORDER — CLOTRIMAZOLE 1 % EX SOLN
1.0000 | Freq: Two times a day (BID) | CUTANEOUS | 5 refills | Status: DC
Start: 2019-12-29 — End: 2020-10-11

## 2019-12-29 MED ORDER — AMMONIUM LACTATE 12 % EX LOTN: 1.0000 "application " | TOPICAL_LOTION | CUTANEOUS | 0 refills | Status: DC | PRN

## 2019-12-29 NOTE — Progress Notes (Addendum)
Subjective: Christine Bernard is a 49 y.o. female patient seen today in office with complaint of mildly painful nails that have fallen off and scaly and itchy and has tried [employer] which has not helped. Patient has no other pedal complaints at this time.   Review of Systems  All other systems reviewed and are negative.   Patient Active Problem List   Diagnosis Date Noted  . Chronic cholecystitis with calculus 10/20/2012    Current Outpatient Medications on File Prior to Visit  Medication Sig Dispense Refill  . Cholecalciferol (VITAMIN D PO) Take 2 tablets by mouth daily.     No current facility-administered medications on file prior to visit.    No Known Allergies  Objective: Physical Exam  General: Well developed, nourished, no acute distress, awake, alert and oriented x 3  Vascular: Dorsalis pedis artery 2/4 bilateral, Posterior tibial artery 2/4 bilateral, skin temperature warm to warm proximal to distal bilateral lower extremities, no varicosities, pedal hair present bilateral.  Neurological: Gross sensation present via light touch bilateral.   Dermatological: Skin is warm, dry, and supple bilateral, Nails 1-10 are short, thick, and discolored with mild subungal debris, severe dry skin with tinea in moscassin distrubution.  Musculoskeletal: Asymptomatic pes planus boney deformities noted bilateral. Muscular strength within normal limits without painon range of motion. No pain with calf compression bilateral.  Assessment and Plan:  Problem List Items Addressed This Visit    None    Visit Diagnoses    Tinea    -  Primary   Relevant Medications   terbinafine (LAMISIL) 250 MG tablet   nystatin-triamcinolone ointment (MYCOLOG)   clotrimazole (LOTRIMIN) 1 % external solution   Onychomycosis       Relevant Medications   terbinafine (LAMISIL) 250 MG tablet   nystatin-triamcinolone ointment (MYCOLOG)   clotrimazole (LOTRIMIN) 1 % external solution   Foot pain, bilateral          -Examined patient.  -Discussed treatment options for painful mycotic nails and tinea -Rx Clotrimazole, mycolog, and Amlactin -Rx Lamisil after I reviewed liver panel which is normal for 90 days -Advised good hygiene habits -Advised lysol to shoes to prevent re-infection -Patient to return in 1.5 to 2 months for follow up evaluation or sooner if symptoms worsen.  Asencion Islam, DPM

## 2020-01-01 ENCOUNTER — Other Ambulatory Visit (HOSPITAL_COMMUNITY): Payer: BLUE CROSS/BLUE SHIELD

## 2020-01-05 ENCOUNTER — Ambulatory Visit: Admit: 2020-01-05 | Payer: BLUE CROSS/BLUE SHIELD | Admitting: Obstetrics and Gynecology

## 2020-01-05 SURGERY — DILATATION & CURETTAGE/HYSTEROSCOPY WITH MYOSURE
Anesthesia: Choice

## 2020-01-21 ENCOUNTER — Other Ambulatory Visit: Payer: Self-pay | Admitting: Obstetrics and Gynecology

## 2020-01-21 DIAGNOSIS — Z1231 Encounter for screening mammogram for malignant neoplasm of breast: Secondary | ICD-10-CM

## 2020-01-22 ENCOUNTER — Ambulatory Visit
Admission: RE | Admit: 2020-01-22 | Discharge: 2020-01-22 | Disposition: A | Discharge status: Home / Self Care | Payer: 59 | Source: Ambulatory Visit | Attending: Obstetrics and Gynecology | Admitting: Obstetrics and Gynecology

## 2020-01-22 ENCOUNTER — Other Ambulatory Visit: Payer: Self-pay

## 2020-01-22 DIAGNOSIS — Z1231 Encounter for screening mammogram for malignant neoplasm of breast: Secondary | ICD-10-CM

## 2020-02-02 ENCOUNTER — Other Ambulatory Visit: Payer: Self-pay

## 2020-02-02 ENCOUNTER — Ambulatory Visit (INDEPENDENT_AMBULATORY_CARE_PROVIDER_SITE_OTHER): Payer: 59 | Admitting: Sports Medicine

## 2020-02-02 ENCOUNTER — Encounter: Payer: Self-pay | Admitting: Sports Medicine

## 2020-02-02 VITALS — Temp 97.2°F

## 2020-02-02 DIAGNOSIS — B359 Dermatophytosis, unspecified: Secondary | ICD-10-CM

## 2020-02-02 DIAGNOSIS — Z79899 Other long term (current) drug therapy: Secondary | ICD-10-CM | POA: Diagnosis not present

## 2020-02-02 DIAGNOSIS — B351 Tinea unguium: Secondary | ICD-10-CM

## 2020-02-02 DIAGNOSIS — M79671 Pain in right foot: Secondary | ICD-10-CM

## 2020-02-02 DIAGNOSIS — M79672 Pain in left foot: Secondary | ICD-10-CM

## 2020-02-02 MED ORDER — NYSTATIN-TRIAMCINOLONE 100000-0.1 UNIT/GM-% EX OINT: 1.0000 "application " | TOPICAL_OINTMENT | Freq: Two times a day (BID) | CUTANEOUS | 0 refills | Status: DC

## 2020-02-02 NOTE — Progress Notes (Signed)
Subjective: Christine Bernard is a 49 y.o. female patient returns office for follow-up evaluation of tinea to both feet and for nail fungus currently on Lamisil.  Patient reports that the medication has helped her nails and skin still dark especially on the big toes and the fourth and fifth toes but otherwise is looking better.  Patient reports that the creams and the lotion as well as the solution has helped as well.  Patient reports that she is out of the cream/ointment but otherwise has been consistent with using all other medications as prescribed.  Patient denies any nausea vomiting fever chills bloating rash or increase in swelling or pain or yellowing of the eyes since being on Lamisil by mouth.  Patient has no other pedal complaints at this time.   Patient Active Problem List   Diagnosis Date Noted  . Chronic cholecystitis with calculus 10/20/2012    Current Outpatient Medications on File Prior to Visit  Medication Sig Dispense Refill  . ammonium lactate (AMLACTIN) 12 % lotion Apply 1 application topically as needed for dry skin. 400 g 0  . Cholecalciferol (VITAMIN D PO) Take 2 tablets by mouth daily.    . clotrimazole (LOTRIMIN) 1 % external solution Apply 1 application topically 2 (two) times daily. In between toes 60 mL 5  . phentermine 15 MG capsule Take by mouth every morning. Unsure of dosage    . terbinafine (LAMISIL) 250 MG tablet Take 1 tablet (250 mg total) by mouth daily. 90 tablet 0  . metFORMIN (GLUCOPHAGE) 500 MG tablet Take 500 mg by mouth daily.     No current facility-administered medications on file prior to visit.    No Known Allergies  Objective: Physical Exam  General: Well developed, nourished, no acute distress, awake, alert and oriented x 3  Vascular: Dorsalis pedis artery 2/4 bilateral, Posterior tibial artery 2/4 bilateral, skin temperature warm to warm proximal to distal bilateral lower extremities, no varicosities, pedal hair present  bilateral.  Neurological: Gross sensation present via light touch bilateral.   Dermatological: Skin is warm, dry, and supple bilateral, Nails 1-10 are short, thick, and discolored with mild subungal debris bilateral hallux and fourth and fifth toes most involved, severe dry skin with tinea in moscassin distrubution that is improving in nature.  Musculoskeletal: Asymptomatic pes planus boney deformities noted bilateral. Muscular strength within normal limits without painon range of motion. No pain with calf compression bilateral.  Assessment and Plan:  Problem List Items Addressed This Visit    None    Visit Diagnoses    Long-term use of high-risk medication    -  Primary   Relevant Orders   Hepatic Function Panel   Tinea       Relevant Medications   nystatin-triamcinolone ointment (MYCOLOG)   Onychomycosis       Relevant Medications   nystatin-triamcinolone ointment (MYCOLOG)   Foot pain, bilateral         -Examined patient.  -Discussed treatment options for painful mycotic nails and tinea -Continue with clotrimazole, mycolog, and Amlactin; refill Mycolog ointment this visit -Continue with Lamisil by mouth and patient was instructed to get liver function test done prior to next visit if abnormal will call patient to advise her to stop the medication -Advised patient to continue with good hygiene habits -Advised lysol to shoes to prevent re-infection like before especially in sandals and shoes that she wears without socks -Patient to return in 6 weeks for follow up evaluation or sooner if symptoms worsen.  Landis Martins, DPM

## 2020-03-17 ENCOUNTER — Other Ambulatory Visit: Payer: Self-pay

## 2020-03-17 ENCOUNTER — Encounter: Payer: Self-pay | Admitting: Sports Medicine

## 2020-03-17 ENCOUNTER — Ambulatory Visit (INDEPENDENT_AMBULATORY_CARE_PROVIDER_SITE_OTHER): Payer: 59 | Admitting: Sports Medicine

## 2020-03-17 DIAGNOSIS — B351 Tinea unguium: Secondary | ICD-10-CM | POA: Diagnosis not present

## 2020-03-17 DIAGNOSIS — Z79899 Other long term (current) drug therapy: Secondary | ICD-10-CM

## 2020-03-17 DIAGNOSIS — M79671 Pain in right foot: Secondary | ICD-10-CM

## 2020-03-17 DIAGNOSIS — M79672 Pain in left foot: Secondary | ICD-10-CM

## 2020-03-17 DIAGNOSIS — B359 Dermatophytosis, unspecified: Secondary | ICD-10-CM | POA: Diagnosis not present

## 2020-03-17 NOTE — Progress Notes (Signed)
Subjective: Christine Bernard is a 49 y.o. female patient returns office for follow-up evaluation of tinea to both feet and for nail fungus currently on Lamisil denies any pedal complaints or complications reports that she feels like the medication has helped but did stop it for a few days because was concerned about her liver even though her blood work from her PCP was normal.   Patient Active Problem List   Diagnosis Date Noted  . Chronic cholecystitis with calculus 10/20/2012    Current Outpatient Medications on File Prior to Visit  Medication Sig Dispense Refill  . ammonium lactate (AMLACTIN) 12 % lotion Apply 1 application topically as needed for dry skin. 400 g 0  . Cholecalciferol (VITAMIN D PO) Take 2 tablets by mouth daily.    . clotrimazole (LOTRIMIN) 1 % external solution Apply 1 application topically 2 (two) times daily. In between toes 60 mL 5  . metFORMIN (GLUCOPHAGE) 500 MG tablet Take 500 mg by mouth daily.    Marland Kitchen nystatin-triamcinolone ointment (MYCOLOG) Apply 1 application topically 2 (two) times daily. 30 g 0  . phentermine (ADIPEX-P) 37.5 MG tablet Take 37.5 mg by mouth daily.    . phentermine 15 MG capsule Take by mouth every morning. Unsure of dosage    . terbinafine (LAMISIL) 250 MG tablet Take 1 tablet (250 mg total) by mouth daily. 90 tablet 0  . Vitamin D, Ergocalciferol, (DRISDOL) 1.25 MG (50000 UNIT) CAPS capsule Take 50,000 Units by mouth once a week.     No current facility-administered medications on file prior to visit.    No Known Allergies  Objective: Physical Exam  General: Well developed, nourished, no acute distress, awake, alert and oriented x 3  Vascular: Dorsalis pedis artery 2/4 bilateral, Posterior tibial artery 2/4 bilateral, skin temperature warm to warm proximal to distal bilateral lower extremities, no varicosities, pedal hair present bilateral.  Neurological: Gross sensation present via light touch bilateral.   Dermatological: Skin is  warm, dry, and supple bilateral, Nails 1-10 are short, thick, and discolored with mild subungal debris bilateral hallux and fourth and fifth toes most involved, much improved dry skin with tinea in moscassin distribution.  Musculoskeletal: Asymptomatic pes planus boney deformities noted bilateral. Muscular strength within normal limits without painon range of motion. No pain with calf compression bilateral.  Assessment and Plan:  Problem List Items Addressed This Visit    None    Visit Diagnoses    Long-term use of high-risk medication    -  Primary   Tinea       Onychomycosis       Foot pain, bilateral         -Examined patient.  -Discussed treatment options for painful mycotic nails and tinea -Continue with clotrimazole, mycolog, and Amlactin; refill Mycolog ointment until completed -Continue with Lamisil by mouth until completed -Advised patient to continue with good hygiene habits -Patient to return if fails to continue to improve or sooner if symptoms worsen.  Asencion Islam, DPM

## 2020-10-05 ENCOUNTER — Ambulatory Visit: Payer: 59 | Admitting: Obstetrics

## 2020-10-17 ENCOUNTER — Ambulatory Visit (HOSPITAL_COMMUNITY): Admission: RE | Admit: 2020-10-17 | Payer: 59 | Source: Ambulatory Visit | Admitting: Obstetrics and Gynecology

## 2020-10-17 ENCOUNTER — Encounter (HOSPITAL_COMMUNITY): Admission: RE | Payer: Self-pay | Source: Ambulatory Visit

## 2020-10-17 SURGERY — DILATATION & CURETTAGE/HYSTEROSCOPY WITH MYOSURE
Anesthesia: General

## 2020-10-25 ENCOUNTER — Other Ambulatory Visit: Payer: Self-pay | Admitting: Sports Medicine

## 2020-10-25 NOTE — Telephone Encounter (Signed)
Please advise 

## 2020-10-31 ENCOUNTER — Telehealth: Payer: Self-pay | Admitting: *Deleted

## 2020-10-31 NOTE — Telephone Encounter (Addendum)
Called and scheduled the patient for a new patient appt on 4/8 at 9 am with an arrival time of 8:30 am. Patient given the address and phone number for the clinic. Patient also given the policy for mask and visitors. Patient offered earlier appt and declined

## 2020-11-09 ENCOUNTER — Encounter: Payer: Self-pay | Admitting: Gynecologic Oncology

## 2020-11-11 ENCOUNTER — Encounter (HOSPITAL_COMMUNITY): Payer: Self-pay | Admitting: Gynecologic Oncology

## 2020-11-11 ENCOUNTER — Inpatient Hospital Stay: Payer: 59 | Attending: Gynecologic Oncology | Admitting: Gynecologic Oncology

## 2020-11-11 ENCOUNTER — Other Ambulatory Visit: Payer: Self-pay

## 2020-11-11 ENCOUNTER — Encounter: Payer: Self-pay | Admitting: Gynecologic Oncology

## 2020-11-11 ENCOUNTER — Other Ambulatory Visit: Payer: Self-pay | Admitting: Gynecologic Oncology

## 2020-11-11 ENCOUNTER — Other Ambulatory Visit (HOSPITAL_COMMUNITY): Payer: Self-pay | Admitting: Gynecologic Oncology

## 2020-11-11 ENCOUNTER — Other Ambulatory Visit (HOSPITAL_COMMUNITY)
Admission: RE | Admit: 2020-11-11 | Discharge: 2020-11-11 | Disposition: A | Discharge status: Home / Self Care | Payer: 59 | Source: Ambulatory Visit | Attending: Gynecologic Oncology | Admitting: Gynecologic Oncology

## 2020-11-11 VITALS — BP 135/75 | HR 95 | Resp 20 | Ht 64.0 in | Wt 319.0 lb

## 2020-11-11 DIAGNOSIS — K219 Gastro-esophageal reflux disease without esophagitis: Secondary | ICD-10-CM | POA: Diagnosis not present

## 2020-11-11 DIAGNOSIS — Z8673 Personal history of transient ischemic attack (TIA), and cerebral infarction without residual deficits: Secondary | ICD-10-CM | POA: Diagnosis not present

## 2020-11-11 DIAGNOSIS — N939 Abnormal uterine and vaginal bleeding, unspecified: Secondary | ICD-10-CM | POA: Diagnosis not present

## 2020-11-11 DIAGNOSIS — G4730 Sleep apnea, unspecified: Secondary | ICD-10-CM | POA: Diagnosis not present

## 2020-11-11 DIAGNOSIS — N921 Excessive and frequent menstruation with irregular cycle: Secondary | ICD-10-CM | POA: Diagnosis not present

## 2020-11-11 DIAGNOSIS — E669 Obesity, unspecified: Secondary | ICD-10-CM | POA: Insufficient documentation

## 2020-11-11 DIAGNOSIS — Z20822 Contact with and (suspected) exposure to covid-19: Secondary | ICD-10-CM | POA: Insufficient documentation

## 2020-11-11 DIAGNOSIS — Z6841 Body Mass Index (BMI) 40.0 and over, adult: Secondary | ICD-10-CM | POA: Diagnosis not present

## 2020-11-11 DIAGNOSIS — Z01812 Encounter for preprocedural laboratory examination: Secondary | ICD-10-CM | POA: Insufficient documentation

## 2020-11-11 DIAGNOSIS — K580 Irritable bowel syndrome with diarrhea: Secondary | ICD-10-CM | POA: Insufficient documentation

## 2020-11-11 NOTE — Progress Notes (Signed)
GYNECOLOGIC ONCOLOGY NEW PATIENT CONSULTATION   Patient Name: Christine Bernard  Patient Age: 50 y.o. Date of Service: 11/11/20 Referring Provider: Irene Pap MD  Primary Care Provider: Lawerance Cruel, MD Consulting Provider: Jeral Pinch, MD   Assessment/Plan:  50 year old with multiple year history of abnormal uterine bleeding that I suspect is anovulatory bleeding.  I spent time reviewing with the patient and her mother her bleeding history and my suspicion that this is related to anovulatory bleeding as well as exposure to exogenous estrogen due to her obesity.  While I am happy to hear that her bleeding has improved in the short time that she has been on oral progesterone, given the length of abnormal bleeding she is experienced as well as her risk factors, I think that endometrial sampling to rule out hyperplasia or malignancy is necessary.  We discussed the reason for not proceeding immediately to hysterectomy, both and the fact that if she had a precancer or cancer diagnosis that this could change the surgery she needed, but also secondary to her comorbid conditions (notably her weight) which increases her risk related to surgery.  The patient was understanding of this.  If no hyperplasia or malignancy identified, it is very likely that progesterone therapy would be able to help normalize her control her bleeding and that surgery could be avoided.  My recommendation was that we proceed with outpatient surgery likely under MAC including hysteroscopy, endometrial sampling with the MyoSure device, possible transvaginal ultrasound guidance, and possible Mirena IUD insertion.  We discussed that in the setting of no abnormal pathology or in the setting of precancerous changes, that progesterone therapy locally with the intrauterine device would be my recommendation in terms of treatment.  If precancerous changes were identified, the patient may be a surgical candidate for definitive  treatment but I encouraged weight loss prior to considering surgery.  We discussed the role that progesterone can play both in the treatment of abnormal uterine bleeding as well as hyperplasia.  We discussed oral progesterone options as well as the Mirena IUD and the risks and side effects associated with each.  The patient was somewhat resistant initially to the idea of a Mirena IUD, but will think about the possibility of having this placed at the time of surgery.  Given her difficulty with exams and her cervical stenosis, likely from her LEEP procedure, if we are going to move forward with Mirena IUD placement, I strongly encouraged that we do it at the time of surgery when she will be asleep.  We talked about the significant role that her obesity plays in terms of increasing her estrogen exposure and that this puts her at risk for development of overgrowth, hyperplasia, and cancer of her endometrium.  I have encouraged her to work on diet changes and weight loss during this work-up.  We discussed the plan for a hysteroscopy, endometrial sampling with MyoSure, possible ultrasound guidance, possible Mirena IUD insertion. The risks of surgery were discussed in detail and she understands these to include infection; injury to adjacent organs such as bowel, bladder, blood vessels, ureters and nerves; bleeding which may require blood transfusion; anesthesia risk; thromboembolic events; possible death; unforeseen complications; possible need for re-exploration; medical complications such as heart attack, stroke, pleural effusion and pneumonia. The patient will receive DVT and antibiotic prophylaxis as indicated. She voiced a clear understanding. She had the opportunity to ask questions. Perioperative instructions were reviewed with her. Prescriptions for post-op medications were sent to her pharmacy of choice.  A copy of this note was sent to the patient's referring provider.   65 minutes of total time was  spent for this patient encounter, including preparation, face-to-face counseling with the patient and coordination of care, and documentation of the encounter.  Jeral Pinch, MD  Division of Gynecologic Oncology  Department of Obstetrics and Gynecology  [facility]  ___________________________________________  Chief Complaint: Chief Complaint  Patient presents with  . Excessive, frequent and irregular menstruation    History of Present Illness:  Christine Bernard is a 50 y.o. y.o. female who is seen in consultation at the request of Dr. Brien Mates for an evaluation of AUB with inability to sample.  The patient reports at least a 4-year history with menstrual irregularities.  Looking back in her chart, it appears that she was seen initially in the emergency department apartment with heavy bleeding back in 2018.  She describes having a long history of "terrible periods".  4 or so years ago, she notes that her menses began spacing out initially and she would have bleeding about every 6 weeks.  She then would have 3-4 months periods without any bleeding.  More recently, her bleeding has become intermittent.  This can mean anything from bleeding for a couple of days, then not bleeding, and then bleeding again.  She can still go up to about 2 months without having any bleeding.  On her heavier days, she wears either a super long pad or depends.  If she is using a depends, she can wear 1 all day and it is not fully saturated when she changes.  She occasionally will have very bad cramps with her bleeding, but overall denies much pelvic pain or cramping.  She has been unable to tolerate endometrial biopsy attempt in clinic, even after the administration of pain and anxiety medications.  2 weeks ago, she was started on norethindrone.  She has had improvement in her bleeding but still has had some spotting.  In terms of her medical history, she has a history of a stroke about 20  years ago.  This was thought to be related to Depo-Provera (she was not on any estrogen at the time), and no other cause was found.  She has no lasting deficits from this.  She denies any history of heart disease or diabetes.  She has what she describes as IBS with intermittent constipation and diarrhea.  She denies any urinary symptoms.  She reports a good appetite without nausea, emesis, early satiety, or bloating.  Patient lives in [place] with her brother and her 69 year old adopted son.  She denies any tobacco, alcohol, or recreational drug use.  PAST MEDICAL HISTORY:  Past Medical History:  Diagnosis Date  . Abnormal uterine bleeding (AUB)   . Frequent urination   . GERD (gastroesophageal reflux disease)   . IBS (irritable bowel syndrome)   . Sleep apnea    Does not use CPAP diagnosed ~ 10 years  . Stroke [facility]) 2002   No residual effects thought to be secondary to Depo Provera use     PAST SURGICAL HISTORY:  Past Surgical History:  Procedure Laterality Date  . CHOLECYSTECTOMY N/A 11/07/2012   Procedure: LAPAROSCOPIC CHOLECYSTECTOMY WITH INTRAOPERATIVE CHOLANGIOGRAM;  Surgeon: Imogene Burn. Georgette Dover, MD;  Location: [place];  Service: General;  Laterality: N/A;  . LAPAROSCOPIC TUBAL LIGATION Bilateral 06/29/2015   Procedure: LAPAROSCOPIC BILATERAL TUBAL LIGATION;  Surgeon: Janyth Pupa, DO;  Location: [facility];  Service: Gynecology;  Laterality: Bilateral;  .  LEEP  2000    OB/GYN HISTORY:  OB History  Gravida Para Term Preterm AB Living  0 0 0 0 0 0  SAB IAB Ectopic Multiple Live Births  0 0 0 0 0  Obstetric Comments  Has 1 adopted son    No LMP recorded.  Age at menarche: 30  Age at menopause: n/a Hx of HRT: n/a Hx of STDs: Yes, see below. Last pap: 09/2020, negative; HR HPV positive, HPV 16/18/45 negative History of abnormal pap smears: Yes, had a remote abnormal Pap in the early 2000's, was treated with a LEEP.  Reports Paps have been normal since.  She has a history of  HPV.  SCREENING STUDIES:  Last mammogram: 01/2020  Last colonoscopy: N/A  MEDICATIONS: Outpatient Encounter Medications as of 11/11/2020  Medication Sig  . norethindrone (MICRONOR) 0.35 MG tablet Take 1 tablet by mouth daily.  . [DISCONTINUED] ammonium lactate (AMLACTIN) 12 % lotion Apply 1 application topically as needed for dry skin. (Patient not taking: No sig reported)  . [DISCONTINUED] nystatin-triamcinolone ointment (MYCOLOG) APPLY TO AFFECTED AREA TWICE A DAY   No facility-administered encounter medications on file as of 11/11/2020.    ALLERGIES:  No Known Allergies   FAMILY HISTORY:  Family History  Problem Relation Age of Onset  . Breast cancer Paternal Aunt   . Colon cancer Neg Hx   . Ovarian cancer Neg Hx   . Endometrial cancer Neg Hx   . Pancreatic cancer Neg Hx   . Prostate cancer Neg Hx      SOCIAL HISTORY:  Social Connections: Not on file    REVIEW OF SYSTEMS:  Pertinent positives include menstrual problems, vaginal bleeding, vaginal discharge. Denies appetite changes, fevers, chills, fatigue, unexplained weight changes. Denies hearing loss, neck lumps or masses, mouth sores, ringing in ears or voice changes. Denies cough or wheezing.  Denies shortness of breath. Denies chest pain or palpitations. Denies leg swelling. Denies abdominal distention, pain, blood in stools, constipation, diarrhea, nausea, vomiting, or early satiety. Denies pain with intercourse, dysuria, frequency, hematuria or incontinence. Denies hot flashes, pelvic pain.   Denies joint pain, back pain or muscle pain/cramps. Denies itching, rash, or wounds. Denies dizziness, headaches, numbness or seizures. Denies swollen lymph nodes or glands, denies easy bruising or bleeding. Denies anxiety, depression, confusion, or decreased concentration.  Physical Exam:  Vital Signs for this encounter:  Blood pressure 135/75, pulse 95, resp. rate 20, height _0  (1.626 m), weight (!) 319 lb (144.7 kg),  SpO2 98 %. Body mass index is 54.76 kg/m. General: Alert, oriented, no acute distress.  HEENT: Normocephalic, atraumatic. Sclera anicteric.  Chest: Clear to auscultation bilaterally. No wheezes, rhonchi, or rales. Cardiovascular: Regular rate and rhythm, no murmurs, rubs, or gallops.   LABORATORY AND RADIOLOGIC DATA:  Outside medical records were reviewed to synthesize the above history, along with the history and physical obtained during the visit.   Lab Results  Component Value Date   WBC 13.9 (H) 01/13/2017   HGB 13.2 01/13/2017   HCT 39.2 01/13/2017   PLT 283 01/13/2017   GLUCOSE 114 (H) 01/13/2017   ALT 26 01/13/2017   AST 22 01/13/2017   NA 136 01/13/2017   K 4.0 01/13/2017   CL 103 01/13/2017   CREATININE 0.67 01/13/2017   BUN 11 01/13/2017   CO2 24 01/13/2017   Pelvic ultrasound exam on 10/25/2020: Uterus measures 6.5 x 5.5 x 5 cm with an endometrial lining measuring 4.4 mm.  Ovaries not visualized.

## 2020-11-11 NOTE — H&P (View-Only) (Signed)
GYNECOLOGIC ONCOLOGY NEW PATIENT CONSULTATION   Patient Name: Christine Bernard  Patient Age: 50 y.o. Date of Service: 11/11/20 Referring Provider: Irene Pap MD  Primary Care Provider: Lawerance Cruel, MD Consulting Provider: Jeral Pinch, MD   Assessment/Plan:  50 year old with multiple year history of abnormal uterine bleeding that I suspect is anovulatory bleeding.  I spent time reviewing with the patient and her mother her bleeding history and my suspicion that this is related to anovulatory bleeding as well as exposure to exogenous estrogen due to her obesity.  While I am happy to hear that her bleeding has improved in the short time that she has been on oral progesterone, given the length of abnormal bleeding she is experienced as well as her risk factors, I think that endometrial sampling to rule out hyperplasia or malignancy is necessary.  We discussed the reason for not proceeding immediately to hysterectomy, both and the fact that if she had a precancer or cancer diagnosis that this could change the surgery she needed, but also secondary to her comorbid conditions (notably her weight) which increases her risk related to surgery.  The patient was understanding of this.  If no hyperplasia or malignancy identified, it is very likely that progesterone therapy would be able to help normalize her control her bleeding and that surgery could be avoided.  My recommendation was that we proceed with outpatient surgery likely under MAC including hysteroscopy, endometrial sampling with the MyoSure device, possible transvaginal ultrasound guidance, and possible Mirena IUD insertion.  We discussed that in the setting of no abnormal pathology or in the setting of precancerous changes, that progesterone therapy locally with the intrauterine device would be my recommendation in terms of treatment.  If precancerous changes were identified, the patient may be a surgical candidate for definitive  treatment but I encouraged weight loss prior to considering surgery.  We discussed the role that progesterone can play both in the treatment of abnormal uterine bleeding as well as hyperplasia.  We discussed oral progesterone options as well as the Mirena IUD and the risks and side effects associated with each.  The patient was somewhat resistant initially to the idea of a Mirena IUD, but will think about the possibility of having this placed at the time of surgery.  Given her difficulty with exams and her cervical stenosis, likely from her LEEP procedure, if we are going to move forward with Mirena IUD placement, I strongly encouraged that we do it at the time of surgery when she will be asleep.  We talked about the significant role that her obesity plays in terms of increasing her estrogen exposure and that this puts her at risk for development of overgrowth, hyperplasia, and cancer of her endometrium.  I have encouraged her to work on diet changes and weight loss during this work-up.  We discussed the plan for a hysteroscopy, endometrial sampling with MyoSure, possible ultrasound guidance, possible Mirena IUD insertion. The risks of surgery were discussed in detail and she understands these to include infection; injury to adjacent organs such as bowel, bladder, blood vessels, ureters and nerves; bleeding which may require blood transfusion; anesthesia risk; thromboembolic events; possible death; unforeseen complications; possible need for re-exploration; medical complications such as heart attack, stroke, pleural effusion and pneumonia. The patient will receive DVT and antibiotic prophylaxis as indicated. She voiced a clear understanding. She had the opportunity to ask questions. Perioperative instructions were reviewed with her. Prescriptions for post-op medications were sent to her pharmacy of choice.  A copy of this note was sent to the patient's referring provider.   48 minutes of total time was  spent for this patient encounter, including preparation, face-to-face counseling with the patient and coordination of care, and documentation of the encounter.  Jeral Pinch, MD  Division of Gynecologic Oncology  Department of Obstetrics and Gynecology  [facility]  ___________________________________________  Chief Complaint: Chief Complaint  Patient presents with  . Excessive, frequent and irregular menstruation    History of Present Illness:  Christine Bernard is a 50 y.o. y.o. female who is seen in consultation at the request of Dr. Brien Mates for an evaluation of AUB with inability to sample.  The patient reports at least a 4-year history with menstrual irregularities.  Looking back in her chart, it appears that she was seen initially in the emergency department apartment with heavy bleeding back in 2018.  She describes having a long history of "terrible periods".  4 or so years ago, she notes that her menses began spacing out initially and she would have bleeding about every 6 weeks.  She then would have 3-4 months periods without any bleeding.  More recently, her bleeding has become intermittent.  This can mean anything from bleeding for a couple of days, then not bleeding, and then bleeding again.  She can still go up to about 2 months without having any bleeding.  On her heavier days, she wears either a super long pad or depends.  If she is using a depends, she can wear 1 all day and it is not fully saturated when she changes.  She occasionally will have very bad cramps with her bleeding, but overall denies much pelvic pain or cramping.  She has been unable to tolerate endometrial biopsy attempt in clinic, even after the administration of pain and anxiety medications.  2 weeks ago, she was started on norethindrone.  She has had improvement in her bleeding but still has had some spotting.  In terms of her medical history, she has a history of a stroke about 48  years ago.  This was thought to be related to Depo-Provera (she was not on any estrogen at the time), and no other cause was found.  She has no lasting deficits from this.  She denies any history of heart disease or diabetes.  She has what she describes as IBS with intermittent constipation and diarrhea.  She denies any urinary symptoms.  She reports a good appetite without nausea, emesis, early satiety, or bloating.  Patient lives in [place] with her brother and her 69 year old adopted son.  She denies any tobacco, alcohol, or recreational drug use.  PAST MEDICAL HISTORY:  Past Medical History:  Diagnosis Date  . Abnormal uterine bleeding (AUB)   . Frequent urination   . GERD (gastroesophageal reflux disease)   . IBS (irritable bowel syndrome)   . Sleep apnea    Does not use CPAP diagnosed ~ 10 years  . Stroke [facility]) 2002   No residual effects thought to be secondary to Depo Provera use     PAST SURGICAL HISTORY:  Past Surgical History:  Procedure Laterality Date  . CHOLECYSTECTOMY N/A 11/07/2012   Procedure: LAPAROSCOPIC CHOLECYSTECTOMY WITH INTRAOPERATIVE CHOLANGIOGRAM;  Surgeon: Imogene Burn. Georgette Dover, MD;  Location: [place];  Service: General;  Laterality: N/A;  . LAPAROSCOPIC TUBAL LIGATION Bilateral 06/29/2015   Procedure: LAPAROSCOPIC BILATERAL TUBAL LIGATION;  Surgeon: Janyth Pupa, DO;  Location: [facility];  Service: Gynecology;  Laterality: Bilateral;  .  LEEP  2000    OB/GYN HISTORY:  OB History  Gravida Para Term Preterm AB Living  0 0 0 0 0 0  SAB IAB Ectopic Multiple Live Births  0 0 0 0 0  Obstetric Comments  Has 1 adopted son    No LMP recorded.  Age at menarche: 10  Age at menopause: n/a Hx of HRT: n/a Hx of STDs: Yes, see below. Last pap: 09/2020, negative; HR HPV positive, HPV 16/18/45 negative History of abnormal pap smears: Yes, had a remote abnormal Pap in the early 2000's, was treated with a LEEP.  Reports Paps have been normal since.  She has a history of  HPV.  SCREENING STUDIES:  Last mammogram: 01/2020  Last colonoscopy: N/A  MEDICATIONS: Outpatient Encounter Medications as of 11/11/2020  Medication Sig  . norethindrone (MICRONOR) 0.35 MG tablet Take 1 tablet by mouth daily.  . [DISCONTINUED] ammonium lactate (AMLACTIN) 12 % lotion Apply 1 application topically as needed for dry skin. (Patient not taking: No sig reported)  . [DISCONTINUED] nystatin-triamcinolone ointment (MYCOLOG) APPLY TO AFFECTED AREA TWICE A DAY   No facility-administered encounter medications on file as of 11/11/2020.    ALLERGIES:  No Known Allergies   FAMILY HISTORY:  Family History  Problem Relation Age of Onset  . Breast cancer Paternal Aunt   . Colon cancer Neg Hx   . Ovarian cancer Neg Hx   . Endometrial cancer Neg Hx   . Pancreatic cancer Neg Hx   . Prostate cancer Neg Hx      SOCIAL HISTORY:  Social Connections: Not on file    REVIEW OF SYSTEMS:  Pertinent positives include menstrual problems, vaginal bleeding, vaginal discharge. Denies appetite changes, fevers, chills, fatigue, unexplained weight changes. Denies hearing loss, neck lumps or masses, mouth sores, ringing in ears or voice changes. Denies cough or wheezing.  Denies shortness of breath. Denies chest pain or palpitations. Denies leg swelling. Denies abdominal distention, pain, blood in stools, constipation, diarrhea, nausea, vomiting, or early satiety. Denies pain with intercourse, dysuria, frequency, hematuria or incontinence. Denies hot flashes, pelvic pain.   Denies joint pain, back pain or muscle pain/cramps. Denies itching, rash, or wounds. Denies dizziness, headaches, numbness or seizures. Denies swollen lymph nodes or glands, denies easy bruising or bleeding. Denies anxiety, depression, confusion, or decreased concentration.  Physical Exam:  Vital Signs for this encounter:  Blood pressure 135/75, pulse 95, resp. rate 20, height _0  (1.626 m), weight (!) 319 lb (144.7 kg),  SpO2 98 %. Body mass index is 54.76 kg/m. General: Alert, oriented, no acute distress.  HEENT: Normocephalic, atraumatic. Sclera anicteric.  Chest: Clear to auscultation bilaterally. No wheezes, rhonchi, or rales. Cardiovascular: Regular rate and rhythm, no murmurs, rubs, or gallops.   LABORATORY AND RADIOLOGIC DATA:  Outside medical records were reviewed to synthesize the above history, along with the history and physical obtained during the visit.   Lab Results  Component Value Date   WBC 13.9 (H) 01/13/2017   HGB 13.2 01/13/2017   HCT 39.2 01/13/2017   PLT 283 01/13/2017   GLUCOSE 114 (H) 01/13/2017   ALT 26 01/13/2017   AST 22 01/13/2017   NA 136 01/13/2017   K 4.0 01/13/2017   CL 103 01/13/2017   CREATININE 0.67 01/13/2017   BUN 11 01/13/2017   CO2 24 01/13/2017   Pelvic ultrasound exam on 10/25/2020: Uterus measures 6.5 x 5.5 x 5 cm with an endometrial lining measuring 4.4 mm.  Ovaries not visualized.

## 2020-11-11 NOTE — Patient Instructions (Signed)
Preparing for your Surgery  Plan for surgery on November 15, 2020 with Dr. Eugene Garnet at [facility]. You will be scheduled for a hysteroscopy and sampling with Myosure, possible Mirena IUD placement, possible ultrasound guidance.   Pre-operative Testing -You will receive a phone call from presurgical testing at [facility] to arrange for a pre-operative appointment, labs, and COVID test. The COVID test normally happens 3 days prior to the surgery and they ask that you self quarantine after the test up until surgery to decrease chance of exposure.  -Bring your insurance card, copy of an advanced directive if applicable, medication list  -You should not be taking blood thinners or aspirin at least ten days prior to surgery unless instructed by your surgeon.  -Do not take supplements such as fish oil (omega 3), red yeast rice, turmeric before your surgery. You want to avoid medications with aspirin in them including headache powders such as BC or Goody's), Excedrin migraine.  Day Before Surgery at Home -You will be advised you can have clear liquids up until 3 hours before your surgery.    Your role in recovery Your role is to become active as soon as directed by your doctor, while still giving yourself time to heal.  Rest when you feel tired. You will be asked to do the following in order to speed your recovery:  - Cough and breathe deeply. This helps to clear and expand your lungs and can prevent pneumonia after surgery.  - STAY ACTIVE WHEN YOU GET HOME. Do mild physical activity. Walking or moving your legs help your circulation and body functions return to normal. Do not try to get up or walk alone the first time after surgery.   -If you develop swelling on one leg or the other, pain in the back of your leg, redness/warmth in one of your legs, please call the office or go to the Emergency Room to have a doppler to rule out a blood clot. For shortness of breath, chest  pain-seek care in the Emergency Room as soon as possible. - Actively manage your pain. Managing your pain lets you move in comfort. We will ask you to rate your pain on a scale of zero to 10. It is your responsibility to tell your doctor or nurse where and how much you hurt so your pain can be treated.  Special Considerations -Your final pathology results from surgery should be available around one week after surgery and the results will be relayed to you when available.  -FMLA forms can be faxed to [phone] and please allow 5-7 business days for completion.  Pain Management After Surgery  -Make sure that you have Tylenol and Ibuprofen at home to use on a regular basis after surgery for pain control. We recommend alternating the medications every hour to six hours since they work differently and are processed in the body differently for pain relief.  Bowel Regimen It is important to prevent constipation and drink adequate amounts of liquids.   Risks of Surgery Risks of surgery are low but include bleeding, infection, damage to surrounding structures, re-operation, blood clots, and very rarely death.  AFTER SURGERY INSTRUCTIONS  Return to work: 1-2 days if applicable  Activity: 1. Be up and out of the bed during the day.  Take a nap if needed.  You may walk up steps but be careful and use the hand rail.  Stair climbing will tire you more than you think, you may need to  stop part way and rest.   2. No lifting or straining for 2 weeks over 10 pounds. No pushing, pulling, straining for 2 weeks.  3. No driving for minimum of 24 hours after the procedure.  Make sure that your reaction time has returned.   4. You can shower as soon as the next day after surgery.No tub baths or submerging your body in water until cleared by your surgeon.   5. No sexual activity and nothing in the vagina for 4 weeks.  6. You may experience vaginal spotting after the procedure.  The spotting is normal but  if you experience heavy bleeding, call our office.  9. Take Tylenol or ibuprofen for pain. Monitor your Tylenol intake to a max of 4,000 mg in a 24 hour period. You can alternate these medications after surgery.  Diet: 1. Low sodium Heart Healthy Diet is recommended but you are cleared to resume your normal (before surgery) diet after your procedure.  2. It is safe to use a laxative, such as Miralax or Colace, if you have difficulty moving your bowels.   Reasons to call the Doctor:  Fever - Oral temperature greater than 100.4 degrees Fahrenheit  Foul-smelling vaginal discharge  Difficulty urinating  Nausea and vomiting  Difficulty breathing with or without chest pain  New calf pain especially if only on one side  Sudden, continuing increased vaginal bleeding with or without clots.   Contacts: For questions or concerns you should contact:  Dr. Eugene Garnet at [phone]  Warner Mccreedy, NP at [phone]  After Hours: call [phone] and have the GYN Oncologist paged/contacted (after 5 pm or on the weekends).  Messages sent via mychart are for non-urgent matters and are not responded to after hours so for urgent needs, please call the after hours number.   Intrauterine Device Insertion  An intrauterine device (IUD) is a medical device that gets inserted into the uterus to prevent pregnancy. It is a small, T-shaped device that has one or two nylon strings hanging down from it. The strings hang out of the lower part of the uterus (cervix) to allow for future IUD removal. There are two types of IUDs available:  Copper IUD. This type of IUD has copper wire wrapped around it. Copper makes the uterus and fallopian tubes produce a fluid that kills sperm. A copper IUD may last up to 10 years.  Hormone IUD. This type of IUD is made of plastic and contains the hormone progestin (synthetic progesterone). The hormone thickens mucus in the cervix and prevents sperm from entering  the uterus. It also thins the uterine lining to prevent implantation of a fertilized egg. The hormone can weaken or kill the sperm that get into the uterus. A hormone IUD may last 3-5 years. Tell a health care provider about:  Any allergies you have.  All medicines you are taking, including vitamins, herbs, eye drops, creams, and over-the-counter medicines.  Any problems you or family members have had with anesthetic medicines.  Any blood disorders you have.  Any surgeries you have had.  Any medical conditions you have, including any STIs (sexually transmitted infections) you may have.  Whether you are pregnant or may be pregnant. What are the risks? Generally, this is a safe procedure. However, problems may occur, including:  Infection.  Bleeding.  Allergic reactions to medicines.  Accidental puncture (perforation) of the uterus, or damage to other structures or organs.  Accidental placement of the IUD either in the muscle layer of the  uterus (myometrium) or outside the uterus.  The IUD falling out of the uterus (expulsion). This is more common among women who have recently had a child.  Pregnancy that happens in the fallopian tube (ectopic pregnancy).  Infection of the uterus and fallopian tubes (pelvic inflammatory disease). What happens before the procedure?  Schedule the IUD insertion for when you will have your menstrual period or right after, to make sure you are not pregnant. Placement of the IUD is better tolerated shortly after a menstrual cycle.  Follow instructions from your health care provider about eating or drinking restrictions.  Ask your health care provider about changing or stopping your regular medicines. This is especially important if you are taking diabetes medicines or blood thinners.  You may get a pain reliever to take before the procedure.  You may have tests for:  Pregnancy. A pregnancy test involves having a urine sample taken.  STIs.  Placing an IUD in someone who has an STI can make the infection worse.  Cervical cancer. You may have a Pap test to check for this type of cancer. This means collecting cells from your cervix to be examined under a microscope.  You may have a physical exam to determine the size and position of your uterus. The procedure may vary among health care providers and hospitals. What happens during the procedure?  A tool (speculum) will be placed in your vagina and widened so that your health care provider can see your cervix.  Medicine may be applied to your cervix to help lower your risk of infection (antiseptic medicine).  You may be given an anesthetic medicine to numb each side of your cervix (intracervical block or paracervical block). This medicine is usually given by an injection into the cervix.  A tool (uterine sound) will be inserted into your uterus to determine the length of your uterus and the direction that your uterus may be tilted.  A slim instrument or tube (IUD inserter) that holds the IUD will be inserted into your vagina, through your cervical canal, and into your uterus.  The IUD will be placed in the uterus, and the IUD inserter will be removed.  The strings that are attached to the IUD will be trimmed so that they lie just below the cervix. The procedure may vary among health care providers and hospitals. What happens after the procedure?  You may have bleeding after the procedure. This is normal. It varies from light bleeding (spotting) for a few days to menstrual-like bleeding.  You may have cramping and pain.  You may feel dizzy or light-headed.  You may have lower back pain. Summary  An intrauterine device (IUD) is a small, T-shaped device that has one or two nylon strings hanging down from it.  Two types of IUDs are available. You may have a copper IUD or a hormone IUD.  Schedule the IUD insertion for when you will have your menstrual period or right after,  to make sure you are not pregnant. Placement of the IUD is better tolerated shortly after a menstrual cycle.  You may have bleeding after the procedure. This is normal. It varies from light spotting for a few days to menstrual-like bleeding. This information is not intended to replace advice given to you by your health care provider. Make sure you discuss any questions you have with your health care provider. Document Released: 03/21/2011 Document Revised: 06/13/2016 Document Reviewed: 06/13/2016 Elsevier Interactive Patient Education  2017 [employer].  Levonorgestrel intrauterine device (  IUD) What is this medicine? LEVONORGESTREL IUD (LEE voe nor jes trel) is a contraceptive (birth control) device. The device is placed inside the uterus by a healthcare professional. It is used to prevent pregnancy. This device can also be used to treat heavy bleeding that occurs during your period. This medicine may be used for other purposes; ask your health care provider or pharmacist if you have questions. COMMON BRAND NAME(S): Cameron AliKyleena, LILETTA, Mirena, Skyla What should I tell my health care provider before I take this medicine? They need to know if you have any of these conditions: -abnormal Pap smear -cancer of the breast, uterus, or cervix -diabetes -endometritis -genital or pelvic infection now or in the past -have more than one sexual partner or your partner has more than one partner -heart disease -history of an ectopic or tubal pregnancy -immune system problems -IUD in place -liver disease or tumor -problems with blood clots or take blood-thinners -seizures -use intravenous drugs -uterus of unusual shape -vaginal bleeding that has not been explained -an unusual or allergic reaction to levonorgestrel, other hormones, silicone, or polyethylene, medicines, foods, dyes, or preservatives -pregnant or trying to get pregnant -breast-feeding How should I use this medicine? This device is  placed inside the uterus by a health care professional. Talk to your pediatrician regarding the use of this medicine in children. Special care may be needed. Overdosage: If you think you have taken too much of this medicine contact a poison control center or emergency room at once. NOTE: This medicine is only for you. Do not share this medicine with others. What if I miss a dose? This does not apply. Depending on the brand of device you have inserted, the device will need to be replaced every 3 to 5 years if you wish to continue using this type of birth control. What may interact with this medicine? Do not take this medicine with any of the following medications: -amprenavir -bosentan -fosamprenavir This medicine may also interact with the following medications: -aprepitant -armodafinil -barbiturate medicines for inducing sleep or treating seizures -bexarotene -boceprevir -griseofulvin -medicines to treat seizures like carbamazepine, ethotoin, felbamate, oxcarbazepine, phenytoin, topiramate -modafinil -pioglitazone -rifabutin -rifampin -rifapentine -some medicines to treat HIV infection like atazanavir, efavirenz, indinavir, lopinavir, nelfinavir, tipranavir, ritonavir -St. John's wort -warfarin This list may not describe all possible interactions. Give your health care provider a list of all the medicines, herbs, non-prescription drugs, or dietary supplements you use. Also tell them if you smoke, drink alcohol, or use illegal drugs. Some items may interact with your medicine. What should I watch for while using this medicine? Visit your doctor or health care professional for regular check ups. See your doctor if you or your partner has sexual contact with others, becomes HIV positive, or gets a sexual transmitted disease. This product does not protect you against HIV infection (AIDS) or other sexually transmitted diseases. You can check the placement of the IUD yourself by reaching up  to the top of your vagina with clean fingers to feel the threads. Do not pull on the threads. It is a good habit to check placement after each menstrual period. Call your doctor right away if you feel more of the IUD than just the threads or if you cannot feel the threads at all. The IUD may come out by itself. You may become pregnant if the device comes out. If you notice that the IUD has come out use a backup birth control method like condoms and call your health care  provider. Using tampons will not change the position of the IUD and are okay to use during your period. This IUD can be safely scanned with magnetic resonance imaging (MRI) only under specific conditions. Before you have an MRI, tell your healthcare provider that you have an IUD in place, and which type of IUD you have in place. What side effects may I notice from receiving this medicine? Side effects that you should report to your doctor or health care professional as soon as possible: -allergic reactions like skin rash, itching or hives, swelling of the face, lips, or tongue -fever, flu-like symptoms -genital sores -high blood pressure -no menstrual period for 6 weeks during use -pain, swelling, warmth in the leg -pelvic pain or tenderness -severe or sudden headache -signs of pregnancy -stomach cramping -sudden shortness of breath -trouble with balance, talking, or walking -unusual vaginal bleeding, discharge -yellowing of the eyes or skin Side effects that usually do not require medical attention (report to your doctor or health care professional if they continue or are bothersome): -acne -breast pain -change in sex drive or performance -changes in weight -cramping, dizziness, or faintness while the device is being inserted -headache -irregular menstrual bleeding within first 3 to 6 months of use -nausea This list may not describe all possible side effects. Call your doctor for medical advice about side effects. You  may report side effects to FDA at 1-800-FDA-1088. Where should I keep my medicine? This does not apply. NOTE: This sheet is a summary. It may not cover all possible information. If you have questions about this medicine, talk to your doctor, pharmacist, or health care provider.  2018 Elsevier/Gold Standard (2016-05-04 14:14:56)

## 2020-11-12 LAB — SARS CORONAVIRUS 2 (TAT 6-24 HRS): SARS Coronavirus 2: NEGATIVE

## 2020-11-14 ENCOUNTER — Encounter (HOSPITAL_COMMUNITY)
Admission: RE | Admit: 2020-11-14 | Discharge: 2020-11-14 | Disposition: A | Discharge status: Home / Self Care | Payer: 59 | Source: Ambulatory Visit | Attending: Gynecologic Oncology | Admitting: Gynecologic Oncology

## 2020-11-14 ENCOUNTER — Encounter (HOSPITAL_COMMUNITY): Payer: Self-pay

## 2020-11-14 ENCOUNTER — Other Ambulatory Visit: Payer: Self-pay

## 2020-11-14 ENCOUNTER — Telehealth: Payer: Self-pay | Admitting: *Deleted

## 2020-11-14 DIAGNOSIS — Z01818 Encounter for other preprocedural examination: Secondary | ICD-10-CM | POA: Insufficient documentation

## 2020-11-14 LAB — BASIC METABOLIC PANEL
Anion gap: 7 (ref 5–15)
BUN: 11 mg/dL (ref 6–20)
CO2: 28 mmol/L (ref 22–32)
Calcium: 8.8 mg/dL — ABNORMAL LOW (ref 8.9–10.3)
Chloride: 101 mmol/L (ref 98–111)
Creatinine, Ser: 0.68 mg/dL (ref 0.44–1.00)
GFR, Estimated: >60 mL/min (ref >60)
Glucose, Bld: 118 mg/dL — ABNORMAL HIGH (ref 70–99)
Potassium: 4.3 mmol/L (ref 3.5–5.1)
Sodium: 136 mmol/L (ref 135–145)

## 2020-11-14 LAB — CBC
HCT: 38.4 % (ref 36.0–46.0)
Hemoglobin: 11.8 g/dL — ABNORMAL LOW (ref 12.0–15.0)
MCH: 28.6 pg (ref 26.0–34.0)
MCHC: 30.7 g/dL (ref 30.0–36.0)
MCV: 93 fL (ref 80.0–100.0)
Platelets: 290 10*3/uL (ref 150–400)
RBC: 4.13 MIL/uL (ref 3.87–5.11)
RDW: 13.5 % (ref 11.5–15.5)
WBC: 8.9 10*3/uL (ref 4.0–10.5)
nRBC: 0 % (ref 0.0–0.2)

## 2020-11-14 NOTE — Progress Notes (Addendum)
Anesthesia Review:  PCP: DR Duane LopeDeboraha Bernard at [facility]  Cardiologist :none  Chest x-ray : EKG :11/14/20 Echo : Stress test: Cardiac Cath :  Activity level: can do a flight of stairs without difficulty  Sleep Study/ CPAP :sleep apena but does not uses cpap  Fasting Blood Sugar :      / Checks Blood Sugar -- times a day:   Blood Thinner/ Instructions /Last Dose: ASA / Instructions/ Last Dose :  B/P elevated at preop appt.  Initial in left arm was 157/115.  Right arm was 176/110.  After few minutes blood pressure decreased to 156/92.  Pt denies any chest pain, headache, or dizziness.  PT reports blood pressure always elevated initially at MD appts then decreases.

## 2020-11-14 NOTE — Progress Notes (Signed)
DUE TO COVID-19 ONLY ONE VISITOR IS ALLOWED TO COME WITH YOU AND STAY IN THE WAITING ROOM ONLY DURING PRE OP AND PROCEDURE DAY OF SURGERY. THE 1 VISITOR  MAY VISIT WITH YOU AFTER SURGERY IN YOUR PRIVATE ROOM DURING VISITING HOURS ONLY!  YOU NEED TO HAVE A COVID 19 TEST ON_______ @_______ , THIS TEST MUST BE DONE BEFORE SURGERY,  COVID TESTING SITE [address] [place] , IT IS ON THE RIGHT GOING OUT [address] APPROXIMATELY  2 MINUTES PAST [employer] ON THE RIGHT. ONCE YOUR COVID TEST IS COMPLETED,  PLEASE BEGIN THE QUARANTINE INSTRUCTIONS AS OUTLINED IN YOUR HANDOUT.                Christine Bernard  11/14/2020   Your procedure is scheduled on:  11/15/2020   Report to [facility] Main  Entrance   Report to admitting at   130 pm     Call this number if you have problems the morning of surgery [phone]    Remember: Do not eat food , candy gum or mints :After Midnight. You may have clear liquids from midnight until 1230pm    CLEAR LIQUID DIET   Foods Allowed                                                                       Coffee and tea, regular and decaf                              Plain Jell-O any favor except red or purple                                            Fruit ices (not with fruit pulp)                                      Iced Popsicles                                     Carbonated beverages, regular and diet                                    Cranberry, grape and apple juices Sports drinks like Gatorade Lightly seasoned clear broth or consume(fat free) Sugar, honey syrup   _____________________________________________________________________    BRUSH YOUR TEETH MORNING OF SURGERY AND RINSE YOUR MOUTH OUT, NO CHEWING GUM CANDY OR MINTS.     Take these medicines the morning of surgery with A SIP OF WATER: none   DO NOT TAKE ANY DIABETIC MEDICATIONS DAY OF YOUR SURGERY                               You may  not have any metal on your body including hair  pins and              piercings  Do not wear jewelry, make-up, lotions, powders or perfumes, deodorant             Do not wear nail polish on your fingernails.  Do not shave  48 hours prior to surgery.              Men may shave face and neck.   Do not bring valuables to the hospital. [place].  Contacts, dentures or bridgework may not be worn into surgery.  Leave suitcase in the car. After surgery it may be brought to your room.     Patients discharged the day of surgery will not be allowed to drive home. IF YOU ARE HAVING SURGERY AND GOING HOME THE SAME DAY, YOU MUST HAVE AN ADULT TO DRIVE YOU HOME AND BE WITH YOU FOR 24 HOURS. YOU MAY GO HOME BY TAXI OR UBER OR ORTHERWISE, BUT AN ADULT MUST ACCOMPANY YOU HOME AND STAY WITH YOU FOR 24 HOURS.  Name and phone number of your driver:  Special Instructions: N/A              Please read over the following fact sheets you were given: _____________________________________________________________________  [facility] - Preparing for Surgery Before surgery, you can play an important role.  Because skin is not sterile, your skin needs to be as free of germs as possible.  You can reduce the number of germs on your skin by washing with CHG (chlorahexidine gluconate) soap before surgery.  CHG is an antiseptic cleaner which kills germs and bonds with the skin to continue killing germs even after washing. Please DO NOT use if you have an allergy to CHG or antibacterial soaps.  If your skin becomes reddened/irritated stop using the CHG and inform your nurse when you arrive at Short Stay. Do not shave (including legs and underarms) for at least 48 hours prior to the first CHG shower.  You may shave your face/neck. Please follow these instructions carefully:  1.  Shower with CHG Soap the night before surgery and the  morning of Surgery.  2.  If you choose to wash  your hair, wash your hair first as usual with your  normal  shampoo.  3.  After you shampoo, rinse your hair and body thoroughly to remove the  shampoo.                           4.  Use CHG as you would any other liquid soap.  You can apply chg directly  to the skin and wash                       Gently with a scrungie or clean washcloth.  5.  Apply the CHG Soap to your body ONLY FROM THE NECK DOWN.   Do not use on face/ open                           Wound or open sores. Avoid contact with eyes, ears mouth and genitals (private parts).                       Wash face,  Genitals (private parts) with your  normal soap.             6.  Wash thoroughly, paying special attention to the area where your surgery  will be performed.  7.  Thoroughly rinse your body with warm water from the neck down.  8.  DO NOT shower/wash with your normal soap after using and rinsing off  the CHG Soap.                9.  Pat yourself dry with a clean towel.            10.  Wear clean pajamas.            11.  Place clean sheets on your bed the night of your first shower and do not  sleep with pets. Day of Surgery : Do not apply any lotions/deodorants the morning of surgery.  Please wear clean clothes to the hospital/surgery center.  FAILURE TO FOLLOW THESE INSTRUCTIONS MAY RESULT IN THE CANCELLATION OF YOUR SURGERY PATIENT SIGNATURE_________________________________  NURSE SIGNATURE__________________________________  ________________________________________________________________________

## 2020-11-14 NOTE — Telephone Encounter (Signed)
I returned Christine Bernard call regarding a billing question for her upcoming surgery. We discussed the estimated charges. She stated that she paid a bill this morning during a preop visit and that she would like to proceed with the surgery as scheduled.

## 2020-11-15 ENCOUNTER — Encounter (HOSPITAL_COMMUNITY)
Admission: RE | Disposition: A | Discharge status: Home / Self Care | Payer: Self-pay | Source: Ambulatory Visit | Attending: Gynecologic Oncology

## 2020-11-15 ENCOUNTER — Ambulatory Visit (HOSPITAL_COMMUNITY): Payer: 59 | Admitting: Anesthesiology

## 2020-11-15 ENCOUNTER — Encounter (HOSPITAL_COMMUNITY): Payer: Self-pay | Admitting: Gynecologic Oncology

## 2020-11-15 ENCOUNTER — Ambulatory Visit (HOSPITAL_COMMUNITY)
Admission: RE | Admit: 2020-11-15 | Discharge: 2020-11-15 | Disposition: A | Discharge status: Home / Self Care | Payer: 59 | Source: Ambulatory Visit | Attending: Gynecologic Oncology | Admitting: Gynecologic Oncology

## 2020-11-15 ENCOUNTER — Encounter (HOSPITAL_COMMUNITY): Payer: Self-pay

## 2020-11-15 DIAGNOSIS — K219 Gastro-esophageal reflux disease without esophagitis: Secondary | ICD-10-CM | POA: Diagnosis not present

## 2020-11-15 DIAGNOSIS — N921 Excessive and frequent menstruation with irregular cycle: Secondary | ICD-10-CM

## 2020-11-15 DIAGNOSIS — N938 Other specified abnormal uterine and vaginal bleeding: Secondary | ICD-10-CM | POA: Insufficient documentation

## 2020-11-15 DIAGNOSIS — Z803 Family history of malignant neoplasm of breast: Secondary | ICD-10-CM | POA: Diagnosis not present

## 2020-11-15 DIAGNOSIS — N939 Abnormal uterine and vaginal bleeding, unspecified: Secondary | ICD-10-CM

## 2020-11-15 DIAGNOSIS — Z8673 Personal history of transient ischemic attack (TIA), and cerebral infarction without residual deficits: Secondary | ICD-10-CM | POA: Diagnosis not present

## 2020-11-15 DIAGNOSIS — Z5309 Procedure and treatment not carried out because of other contraindication: Secondary | ICD-10-CM | POA: Insufficient documentation

## 2020-11-15 HISTORY — DX: Abnormal uterine and vaginal bleeding, unspecified: N93.9

## 2020-11-15 LAB — PREGNANCY, URINE: Preg Test, Ur: NEGATIVE

## 2020-11-15 SURGERY — Surgical Case
Anesthesia: General

## 2020-11-15 MED ORDER — GABAPENTIN 300 MG PO CAPS
300.0000 mg | ORAL_CAPSULE | ORAL | Status: AC
Start: 2020-11-15 — End: 2020-11-15
  Administered 2020-11-15: 300 mg via ORAL
  Filled 2020-11-15: qty 1

## 2020-11-15 MED ORDER — PROPOFOL 10 MG/ML IV BOLUS
INTRAVENOUS | Status: AC
  Filled 2020-11-15: qty 20

## 2020-11-15 MED ORDER — LEVONORGESTREL 20 MCG/24HR IU IUD
INTRAUTERINE_SYSTEM | INTRAUTERINE | Status: DC
Start: 2020-11-15 — End: 2020-11-15
  Filled 2020-11-15: qty 1

## 2020-11-15 MED ORDER — LIDOCAINE HCL (PF) 1 % IJ SOLN
INTRAMUSCULAR | Status: AC
  Filled 2020-11-15: qty 30

## 2020-11-15 MED ORDER — FENTANYL CITRATE (PF) 100 MCG/2ML IJ SOLN: 25.0000 ug | INTRAMUSCULAR | Status: DC | PRN

## 2020-11-15 MED ORDER — MIDAZOLAM HCL 5 MG/5ML IJ SOLN
INTRAMUSCULAR | Status: DC | PRN
  Administered 2020-11-15: 2 mg via INTRAVENOUS

## 2020-11-15 MED ORDER — PROPOFOL 10 MG/ML IV BOLUS
INTRAVENOUS | Status: DC | PRN
  Administered 2020-11-15: 200 mg via INTRAVENOUS

## 2020-11-15 MED ORDER — DEXAMETHASONE SODIUM PHOSPHATE 4 MG/ML IJ SOLN
4.0000 mg | INTRAMUSCULAR | Status: DC
Start: 2020-11-15 — End: 2020-11-15

## 2020-11-15 MED ORDER — LIDOCAINE 2% (20 MG/ML) 5 ML SYRINGE
INTRAMUSCULAR | Status: AC
  Filled 2020-11-15: qty 10

## 2020-11-15 MED ORDER — CELECOXIB 200 MG PO CAPS
400.0000 mg | ORAL_CAPSULE | ORAL | Status: AC
  Administered 2020-11-15: 400 mg via ORAL
  Filled 2020-11-15: qty 2

## 2020-11-15 MED ORDER — LIDOCAINE 2% (20 MG/ML) 5 ML SYRINGE
INTRAMUSCULAR | Status: DC | PRN
  Administered 2020-11-15: 60 mg via INTRAVENOUS

## 2020-11-15 MED ORDER — MIDAZOLAM HCL 2 MG/2ML IJ SOLN
INTRAMUSCULAR | Status: AC
  Filled 2020-11-15: qty 2

## 2020-11-15 MED ORDER — ACETAMINOPHEN 500 MG PO TABS
1000.0000 mg | ORAL_TABLET | ORAL | Status: AC
Start: 2020-11-15 — End: 2020-11-15
  Administered 2020-11-15: 1000 mg via ORAL
  Filled 2020-11-15: qty 2

## 2020-11-15 MED ORDER — PROMETHAZINE HCL 25 MG/ML IJ SOLN: 6.2500 mg | INTRAMUSCULAR | Status: DC | PRN

## 2020-11-15 MED ORDER — LACTATED RINGERS IV SOLN: INTRAVENOUS | Status: DC | PRN

## 2020-11-15 MED ORDER — AMISULPRIDE (ANTIEMETIC) 5 MG/2ML IV SOLN: 10.0000 mg | Freq: Once | INTRAVENOUS | Status: DC | PRN

## 2020-11-15 MED ORDER — SCOPOLAMINE 1 MG/3DAYS TD PT72
1.0000 | MEDICATED_PATCH | TRANSDERMAL | Status: DC
  Administered 2020-11-15: 1.5 mg via TRANSDERMAL
  Filled 2020-11-15: qty 1

## 2020-11-15 MED ORDER — FENTANYL CITRATE (PF) 100 MCG/2ML IJ SOLN
INTRAMUSCULAR | Status: DC | PRN
  Administered 2020-11-15: 50 ug via INTRAVENOUS

## 2020-11-15 MED ORDER — FENTANYL CITRATE (PF) 100 MCG/2ML IJ SOLN
INTRAMUSCULAR | Status: AC
  Filled 2020-11-15: qty 2

## 2020-11-15 MED ORDER — OXYCODONE HCL 5 MG PO TABS: 5.0000 mg | ORAL_TABLET | Freq: Once | ORAL | Status: DC | PRN

## 2020-11-15 MED ORDER — LACTATED RINGERS IV SOLN: INTRAVENOUS | Status: DC

## 2020-11-15 MED ORDER — OXYCODONE HCL 5 MG/5ML PO SOLN: 5.0000 mg | Freq: Once | ORAL | Status: DC | PRN

## 2020-11-15 SURGICAL SUPPLY — 23 items
BIPOLAR CUTTING LOOP 21FR (ELECTRODE)
CANISTER SUCT 3000ML PPV (MISCELLANEOUS) ×1 IMPLANT
CATH ROBINSON RED A/P 16FR (CATHETERS) IMPLANT
COVER WAND RF STERILE (DRAPES) ×3 IMPLANT
DEVICE MYOSURE LITE (MISCELLANEOUS) IMPLANT
DEVICE MYOSURE REACH (MISCELLANEOUS) IMPLANT
DILATOR CANAL MILEX (MISCELLANEOUS) IMPLANT
GAUZE 4X4 16PLY RFD (DISPOSABLE) ×3 IMPLANT
GLOVE SURG ENC MOIS LTX SZ6 (GLOVE) ×3 IMPLANT
GLOVE SURG ENC MOIS LTX SZ6.5 (GLOVE) IMPLANT
GOWN STRL REUS W/TWL LRG LVL3 (GOWN DISPOSABLE) ×3 IMPLANT
IV NS IRRIG 3000ML ARTHROMATIC (IV SOLUTION) ×3 IMPLANT
KIT PROCEDURE FLUENT (KITS) IMPLANT
KIT TURNOVER KIT A (KITS) ×3 IMPLANT
LOOP CUTTING BIPOLAR 21FR (ELECTRODE) IMPLANT
MYOSURE XL FIBROID REM (MISCELLANEOUS)
PACK VAGINAL MINOR WOMEN LF (CUSTOM PROCEDURE TRAY) ×3 IMPLANT
PAD OB MATERNITY 4.3X12.25 (PERSONAL CARE ITEMS) ×3 IMPLANT
PAD PREP 24X48 CUFFED NSTRL (MISCELLANEOUS) ×3 IMPLANT
SEAL ROD LENS SCOPE MYOSURE (ABLATOR) IMPLANT
SYSTEM TISS REMOVAL MYSR XL RM (MISCELLANEOUS) IMPLANT
TOWEL OR 17X26 10 PK STRL BLUE (TOWEL DISPOSABLE) ×3 IMPLANT
WATER STERILE IRR 500ML POUR (IV SOLUTION) ×3 IMPLANT

## 2020-11-15 NOTE — Anesthesia Procedure Notes (Signed)
Procedure Name: LMA Insertion Date/Time: 11/15/2020 3:39 PM Performed by: Elyn Peers, CRNA Pre-anesthesia Checklist: Patient identified, Emergency Drugs available, Suction available, Patient being monitored and Timeout performed Patient Re-evaluated:Patient Re-evaluated prior to induction Oxygen Delivery Method: Circle system utilized Preoxygenation: Pre-oxygenation with 100% oxygen Induction Type: IV induction LMA: LMA inserted LMA Size: 5.0 Number of attempts: 1 Placement Confirmation: positive ETCO2 Tube secured with: Tape Dental Injury: Teeth and Oropharynx as per pre-operative assessment

## 2020-11-15 NOTE — OR Nursing (Signed)
Case cancelled in operating room per anesthesia.

## 2020-11-15 NOTE — Transfer of Care (Signed)
Immediate Anesthesia Transfer of Care Note  Patient: Christine Bernard  Procedure(s) Performed: DILATATION & CURETTAGE/HYSTEROSCOPY WITH MYOSURE (N/A ) INTRAUTERINE DEVICE (IUD) INSERTION MIRENA (N/A ) POSSIBLE OPERATIVE ULTRASOUND (N/A )  Patient Location: PACU  Anesthesia Type:General  Level of Consciousness: awake, alert  and oriented  Airway & Oxygen Therapy: Patient Spontanous Breathing and Patient connected to face mask oxygen  Post-op Assessment: Report given to RN and Post -op Vital signs reviewed and stable  Post vital signs: Reviewed and stable  Last Vitals:  Vitals Value Taken Time  BP 154/102 11/15/20 1615  Temp 36.5 C 11/15/20 1610  Pulse 81 11/15/20 1615  Resp 19 11/15/20 1615  SpO2 99 % 11/15/20 1615    Last Pain:  Vitals:   11/15/20 1610  TempSrc:   PainSc: 0-No pain      Patients Stated Pain Goal: 3 (11/15/20 1343)  Complications: No complications documented.

## 2020-11-15 NOTE — Interval H&P Note (Signed)
History and Physical Interval Note:  11/15/2020 1:12 PM  Christine Bernard  has presented today for surgery, with the diagnosis of ABNORMAL UTERINE BLEEDING.  The various methods of treatment have been discussed with the patient and family. After consideration of risks, benefits and other options for treatment, the patient has consented to  Procedure(s): DILATATION & CURETTAGE/HYSTEROSCOPY WITH MYOSURE (N/A) INTRAUTERINE DEVICE (IUD) INSERTION MIRENA (N/A) POSSIBLE OPERATIVE ULTRASOUND (N/A) as a surgical intervention.  The patient's history has been reviewed, patient examined, no change in status, stable for surgery.  I have reviewed the patient's chart and labs.  Questions were answered to the patient's satisfaction.     Carver Fila

## 2020-11-15 NOTE — Anesthesia Preprocedure Evaluation (Addendum)
Anesthesia Evaluation  Patient identified by MRN, date of birth, ID band Patient awake    Reviewed: Allergy & Precautions, NPO status , Patient's Chart, lab work & pertinent test results  Airway Mallampati: IV  TM Distance: >3 FB Neck ROM: Full    Dental no notable dental hx.    Pulmonary    Pulmonary exam normal breath sounds clear to auscultation       Cardiovascular negative cardio ROS Normal cardiovascular exam Rhythm:Regular Rate:Normal  ECG: NSR, rate 94   Neuro/Psych CVA, No Residual Symptoms negative psych ROS   GI/Hepatic Neg liver ROS, IBS (irritable bowel syndrome)   Endo/Other  Morbid obesity  Renal/GU negative Renal ROS     Musculoskeletal negative musculoskeletal ROS (+)   Abdominal (+) + obese,   Peds  Hematology negative hematology ROS (+)   Anesthesia Other Findings ABNORMAL UTERINE BLEEDING  Reproductive/Obstetrics hcg negative                            Anesthesia Physical Anesthesia Plan  ASA: IV  Anesthesia Plan: General   Post-op Pain Management:    Induction: Intravenous  PONV Risk Score and Plan: 4 or greater and Ondansetron, Dexamethasone, Midazolam, Scopolamine patch - Pre-op and Treatment may vary due to age or medical condition  Airway Management Planned: LMA  Additional Equipment:   Intra-op Plan:   Post-operative Plan: Extubation in OR  Informed Consent: I have reviewed the patients History and Physical, chart, labs and discussed the procedure including the risks, benefits and alternatives for the proposed anesthesia with the patient or authorized representative who has indicated his/her understanding and acceptance.     Dental advisory given  Plan Discussed with: CRNA  Anesthesia Plan Comments:        Anesthesia Quick Evaluation

## 2020-11-15 NOTE — Progress Notes (Signed)
Procedure was canceled due to difficulty after intubation (LMA) with desaturation in the setting of difficult airway.  Decision made to abandon procedure today with plan to repeat at some point in the next couple of weeks with ET intubation.  Eugene Garnet MD Gynecologic Oncology

## 2020-11-16 ENCOUNTER — Encounter: Payer: Self-pay | Admitting: Gynecologic Oncology

## 2020-11-16 NOTE — Anesthesia Postprocedure Evaluation (Signed)
Anesthesia Post Note  Patient: Christine Bernard  Procedure(s) Performed: Procedure name not found.     Patient location during evaluation: PACU Anesthesia Type: General Level of consciousness: awake Pain management: pain level controlled Vital Signs Assessment: post-procedure vital signs reviewed and stable Respiratory status: spontaneous breathing, nonlabored ventilation, respiratory function stable and patient connected to nasal cannula oxygen Cardiovascular status: blood pressure returned to baseline and stable Postop Assessment: no apparent nausea or vomiting Anesthetic complications: no Comments: Called the patient to follow up with her post operative course. Explained the intraoperative events that led to waking her up and not proceeding with surgery. Discussed the case with the operating room administration at [facility] and called the surgeon as well to facilitate a plan. Would recommend placement of an oral endotracheal tube using a video laryngoscope prior to patient positing upon return to the operating room.    No complications documented.  Last Vitals:  Vitals:   11/15/20 1647 11/15/20 1721  BP: (!) 142/92 (!) 161/99  Pulse: 95 90  Resp: (!) 21 20  Temp: 36.6 C 36.5 C  SpO2: 94% 94%    Last Pain:  Vitals:   11/15/20 1721  TempSrc:   PainSc: 0-No pain                 Alazne Quant P Teana Lindahl

## 2020-11-16 NOTE — Progress Notes (Signed)
I called the patient again to speak with her about yesterday's events. Understandably, she voiced frustration with what happened yesterday and not undergoing the planned procedure. I discussed the difficulty with her intubation and that anesthesia did not feel it was appropriate to continue with the procedure. The patient was already incurring a significant cost for the surgery and facility fee. I have assured her that no charges were p laced under me. I will reach out to billing to ensure that this is the case. I will also ask them to contact the patient regarding cost for yesterday's visit as well as any additional surgery as if she elects to reschedule.  Without performing a biopsy of her endometrial lining, we cannot definitively rule out malignancy or hyperplasia. My concern is that given her risk factors and the length of time that she has had abnormal uter ine bleeding, that there may be some pathology causing this bleeding. She asked about the possibility of a different form of progestin to help decrease or stop her bleeding. While this would be possible, does not address ruling out underlying pathology. In an ideal situation, we would proceed with the biopsy as we had planned to perform yesterday. I could send tissue for frozen section at the time of the surgery. If hyperplasia or malig nancy was identified, and the patient was doing well from a pulmonary standpoint and could tolerate Trendelenburg, we could do a stage procedure with a hysterectomy at that time. If no significant pathology was identified on frozen section, then I would still recommend placement of a Mirena intrauterine device for treatment of her abnormal uterine bleeding.  In addition to asking billing to reach out to the patient, I asked if she  would prefer to contact me when she is ready to make a decision about next steps or have me call later this week or next week. Her preference is that I contact her next week in the afternoon  to discuss.  Carin Hock MD

## 2020-11-23 ENCOUNTER — Encounter: Payer: Self-pay | Admitting: Gynecologic Oncology

## 2020-11-23 NOTE — Addendum Note (Signed)
Addendum  created 11/23/20 0803 by Leonides Grills, MD   Clinical Note Signed

## 2020-11-23 NOTE — Anesthesia Postprocedure Evaluation (Signed)
Anesthesia Post Note  Patient: Christine Bernard  Procedure(s) Performed: Procedure name not found.     Patient location during evaluation: PACU Anesthesia Type: General Level of consciousness: awake Pain management: pain level controlled Vital Signs Assessment: post-procedure vital signs reviewed and stable Respiratory status: spontaneous breathing, nonlabored ventilation, respiratory function stable and patient connected to nasal cannula oxygen Cardiovascular status: blood pressure returned to baseline and stable Postop Assessment: no apparent nausea or vomiting Anesthetic complications: yes (Difficulty with oxygenaton and ventilation after patient positioning. This resulted in the termination of anesthesia and the cancellation of surgery. ) Comments: Called the patient to follow up with her post operative course. Explained the intraoperative events that led to waking her up and not proceeding with surgery. Discussed the case with the operating room administration at [facility] and called the surgeon as well to facilitate a plan. Would recommend placement of an oral endotracheal tube using a video laryngoscope prior to patient positing upon return to the operating room.      Last Vitals:  Vitals:   11/15/20 1647 11/15/20 1721  BP: (!) 142/92 (!) 161/99  Pulse: 95 90  Resp: (!) 21 20  Temp: 36.6 C 36.5 C  SpO2: 94% 94%    Last Pain:  Vitals:   11/15/20 1721  TempSrc:   PainSc: 0-No pain                 Dewain Platz P Oronde Hallenbeck

## 2020-11-30 ENCOUNTER — Encounter: Payer: Self-pay | Admitting: Gynecologic Oncology

## 2020-11-30 ENCOUNTER — Other Ambulatory Visit: Payer: Self-pay | Admitting: Gynecologic Oncology

## 2020-11-30 DIAGNOSIS — N939 Abnormal uterine and vaginal bleeding, unspecified: Secondary | ICD-10-CM

## 2020-11-30 MED ORDER — MEDROXYPROGESTERONE ACETATE 10 MG PO TABS: 10.0000 mg | ORAL_TABLET | Freq: Every day | ORAL | 6 refills | Status: DC

## 2020-11-30 NOTE — Progress Notes (Signed)
Talked to patient. I have not heard updates in terms of billing; nor has she. She is understandably wanting to wait with scheduling any additional surgery until she knows the cost of this past aborted surgery.  I discussed that progesterone would work to treat any precancer or cancer if it was there. In an effort to minimize side effects since we are not sure if pathology exists that needs to be treated, I recommended we change to Provera. Patient was amenable. New prescription was sent to her pharmacy.  Eugene Garnet MD Gynecologic Oncology

## 2020-12-01 ENCOUNTER — Telehealth: Payer: Self-pay | Admitting: *Deleted

## 2020-12-01 NOTE — Telephone Encounter (Signed)
Called and canceled the patient's post op appt for 5/3. Also gave the patient the email address for the clinic

## 2020-12-06 ENCOUNTER — Encounter: Payer: 59 | Admitting: Gynecologic Oncology

## 2020-12-15 ENCOUNTER — Other Ambulatory Visit: Payer: Self-pay | Admitting: Gynecologic Oncology

## 2020-12-15 DIAGNOSIS — N939 Abnormal uterine and vaginal bleeding, unspecified: Secondary | ICD-10-CM

## 2020-12-15 MED ORDER — NORETHINDRONE 0.35 MG PO TABS
2.0000 | ORAL_TABLET | Freq: Every day | ORAL | 3 refills | Status: DC
Start: 2020-12-15 — End: 2021-02-24

## 2020-12-15 NOTE — Telephone Encounter (Signed)
Told Christine Bernard that the increased appetite is a side effect of the Provera.  Warner Mccreedy, NP stated that she can try using 2 of the norethindrone 0.35 tablet daily. Discussed potential blood clot formation in the legs or lungs. Told her to call if she develops pain, redness,or swelling in the caves.  If she has any pain in her legs bearing weight. She is to seek emergency care if she develops severe SOB and or chest pain and this could be from a clot in her lungs. Pt verbalized understanding. Warner Mccreedy, NP will discuss with Dr. Pricilla Holm the time length of this treatment and when she needs to follow up with Dr. Pricilla Holm. The office will call her back with this information.

## 2020-12-15 NOTE — Progress Notes (Signed)
See RN note. Pt advised to take 2 tablets of micronor daily.

## 2020-12-16 ENCOUNTER — Telehealth: Payer: Self-pay | Admitting: *Deleted

## 2020-12-16 NOTE — Telephone Encounter (Signed)
I spoke with [place] this morning. We discussed that progesterone is a temporary fix for her bleeding and that Dr. Pricilla Holm still recommends sampling. Ms. Carnero is concerned that if she goes back for sampling the procedure may not work again, that she would be charged again and still be in the the spot she is now. After reading the note from anesthesia, the plan is to use a different type of airway to help her breath during the procedure. Pt to call back when she feels comfortable moving ahead with the procedure.

## 2020-12-20 ENCOUNTER — Telehealth: Payer: Self-pay | Admitting: *Deleted

## 2020-12-20 NOTE — Telephone Encounter (Signed)
I spoke with Ms. Pala to clarify medication and proper dosage. Pt verbalizes understanding.

## 2021-01-16 ENCOUNTER — Encounter: Payer: Self-pay | Admitting: Gynecologic Oncology

## 2021-02-21 ENCOUNTER — Other Ambulatory Visit: Payer: Self-pay | Admitting: Gastroenterology

## 2021-02-22 ENCOUNTER — Other Ambulatory Visit: Payer: Self-pay | Admitting: Family Medicine

## 2021-02-22 DIAGNOSIS — Z1231 Encounter for screening mammogram for malignant neoplasm of breast: Secondary | ICD-10-CM

## 2021-02-24 ENCOUNTER — Telehealth: Payer: Self-pay

## 2021-02-24 DIAGNOSIS — N939 Abnormal uterine and vaginal bleeding, unspecified: Secondary | ICD-10-CM

## 2021-02-24 MED ORDER — NORETHINDRONE 0.35 MG PO TABS: 1.0000 | ORAL_TABLET | Freq: Every day | ORAL | 2 refills | Status: DC

## 2021-02-24 NOTE — Telephone Encounter (Signed)
Told Ms Brabant that the prescription was sent to [facility] for a 90 day supply. Pt is to take 1 tab daily.  Dr. Pricilla Holm glad that the medication is controlling bleeding.  Dr. Pricilla Holm would still like to get tissuse sampling.  Ms Signer set up an appointment for 03-20-21 to discuss with Dr. Pricilla Holm. Previous aborted procedure costs/payment resolved. Ms Situ stated that  [facility] owes her. money

## 2021-02-24 NOTE — Telephone Encounter (Signed)
Spoke with the pharmacist and called in prescription for norethindrone 0.35 mg 1 tablet daily. # 90 1 tab po daily.  With 2 RF.

## 2021-02-27 ENCOUNTER — Other Ambulatory Visit: Payer: Self-pay | Admitting: Gastroenterology

## 2021-02-27 ENCOUNTER — Encounter: Payer: Self-pay | Admitting: Gynecologic Oncology

## 2021-02-27 DIAGNOSIS — R7982 Elevated C-reactive protein (CRP): Secondary | ICD-10-CM

## 2021-03-14 ENCOUNTER — Inpatient Hospital Stay: Payer: 59

## 2021-03-14 ENCOUNTER — Inpatient Hospital Stay: Payer: 59 | Attending: Gynecologic Oncology | Admitting: Gynecologic Oncology

## 2021-03-14 ENCOUNTER — Encounter: Payer: Self-pay | Admitting: Gynecologic Oncology

## 2021-03-14 ENCOUNTER — Other Ambulatory Visit: Payer: Self-pay

## 2021-03-14 VITALS — BP 162/79 | HR 82 | Resp 16

## 2021-03-14 DIAGNOSIS — R102 Pelvic and perineal pain: Secondary | ICD-10-CM | POA: Insufficient documentation

## 2021-03-14 DIAGNOSIS — E669 Obesity, unspecified: Secondary | ICD-10-CM | POA: Insufficient documentation

## 2021-03-14 DIAGNOSIS — F064 Anxiety disorder due to known physiological condition: Secondary | ICD-10-CM | POA: Insufficient documentation

## 2021-03-14 DIAGNOSIS — Z6841 Body Mass Index (BMI) 40.0 and over, adult: Secondary | ICD-10-CM

## 2021-03-14 DIAGNOSIS — R350 Frequency of micturition: Secondary | ICD-10-CM | POA: Diagnosis not present

## 2021-03-14 DIAGNOSIS — N939 Abnormal uterine and vaginal bleeding, unspecified: Secondary | ICD-10-CM | POA: Insufficient documentation

## 2021-03-14 DIAGNOSIS — N921 Excessive and frequent menstruation with irregular cycle: Secondary | ICD-10-CM

## 2021-03-14 DIAGNOSIS — Z8673 Personal history of transient ischemic attack (TIA), and cerebral infarction without residual deficits: Secondary | ICD-10-CM | POA: Insufficient documentation

## 2021-03-14 LAB — CBC (CANCER CENTER ONLY)
HCT: 37 % (ref 36.0–46.0)
Hemoglobin: 12.2 g/dL (ref 12.0–15.0)
MCH: 29.2 pg (ref 26.0–34.0)
MCHC: 33 g/dL (ref 30.0–36.0)
MCV: 88.5 fL (ref 80.0–100.0)
Platelet Count: 279 10*3/uL (ref 150–400)
RBC: 4.18 MIL/uL (ref 3.87–5.11)
RDW: 13.5 % (ref 11.5–15.5)
WBC Count: 10.1 10*3/uL (ref 4.0–10.5)
nRBC: 0 % (ref 0.0–0.2)

## 2021-03-14 MED ORDER — MEDROXYPROGESTERONE ACETATE 5 MG PO TABS: 5.0000 mg | ORAL_TABLET | Freq: Every day | ORAL | 1 refills | Status: DC

## 2021-03-14 NOTE — Patient Instructions (Addendum)
-We will check lab work today and contact you with the results.   -We will also arrange for you to have an ultrasound prior to surgery.  -Our office will touch base on Friday to see how your symptoms are. If they are improved, we will plan to reschedule your surgery for when Dr. Pricilla Holm is back in the office.   -Dr. Pricilla Holm has sent the prescription for provera to your pharmacy with instructions on how to take it.  Preparing for your Surgery  Plan for surgery on March 23, 2021 with Dr. Eugene Garnet at [facility]. You will be scheduled for dilation and curettage of the uterus (this will be sent to the pathologist while you are asleep in surgery), robotic assisted total laparoscopic hysterectomy (removal of the uterus and cervix), bilateral salpingectomy (removal of both fallopian tubes), possible bilateral oophorectomy (removal of both ovaries), possible staging if a cancer is identified.   Pre-operative Testing -You will receive a phone call from presurgical testing at [facility] to arrange for a pre-operative appointment and lab work.  -Bring your insurance card, copy of an advanced directive if applicable, medication list  -At that visit, you will be asked to sign a consent for a possible blood transfusion in case a transfusion becomes necessary during surgery.  The need for a blood transfusion is rare but having consent is a necessary part of your care.     -You should not be taking blood thinners or aspirin at least ten days prior to surgery unless instructed by your surgeon.  -Do not take supplements such as fish oil (omega 3), red yeast rice, turmeric before your surgery. You want to avoid medications with aspirin in them including headache powders such as BC or Goody's), Excedrin migraine.  Day Before Surgery at Home -You will be asked to take in a light diet the day before surgery. You will be advised you can have clear liquids up until 3 hours before your  surgery.    Eat a light diet the day before surgery.  Examples including soups, broths, toast, yogurt, mashed potatoes.  AVOID GAS PRODUCING FOODS. Things to avoid include carbonated beverages (fizzy beverages, sodas), raw fruits and raw vegetables (uncooked), or beans.   If your bowels are filled with gas, your surgeon will have difficulty visualizing your pelvic organs which increases your surgical risks.  Your role in recovery Your role is to become active as soon as directed by your doctor, while still giving yourself time to heal.  Rest when you feel tired. You will be asked to do the following in order to speed your recovery:  - Cough and breathe deeply. This helps to clear and expand your lungs and can prevent pneumonia after surgery.  - STAY ACTIVE WHEN YOU GET HOME. Do mild physical activity. Walking or moving your legs help your circulation and body functions return to normal. Do not try to get up or walk alone the first time after surgery.   -If you develop swelling on one leg or the other, pain in the back of your leg, redness/warmth in one of your legs, please call the office or go to the Emergency Room to have a doppler to rule out a blood clot. For shortness of breath, chest pain-seek care in the Emergency Room as soon as possible. - Actively manage your pain. Managing your pain lets you move in comfort. We will ask you to rate your pain on a scale of zero to 10. It  is your responsibility to tell your doctor or nurse where and how much you hurt so your pain can be treated.  Special Considerations -If you are diabetic, you may be placed on insulin after surgery to have closer control over your blood sugars to promote healing and recovery.  This does not mean that you will be discharged on insulin.  If applicable, your oral antidiabetics will be resumed when you are tolerating a solid diet.  -Your final pathology results from surgery should be available around one week after surgery and  the results will be relayed to you when available.  -Dr. Antionette Char is the surgeon that assists your GYN Oncologist with surgery.  If you end up staying the night, the next day after your surgery you will either see Dr. Andrey Farmer, Dr. Pricilla Holm, or Dr. Antionette Char.  -FMLA forms can be faxed to [phone] and please allow 5-7 business days for completion.  Pain Management After Surgery -You have been prescribed your pain medication and bowel regimen medications before surgery so that you can have these available when you are discharged from the hospital. The pain medication is for use ONLY AFTER surgery and a new prescription will not be given.   -Make sure that you have Tylenol and Ibuprofen at home to use on a regular basis after surgery for pain control. We recommend alternating the medications every hour to six hours since they work differently and are processed in the body differently for pain relief.  -Review the attached handout on narcotic use and their risks and side effects.   Bowel Regimen -You have been prescribed Sennakot-S to take nightly to prevent constipation especially if you are taking the narcotic pain medication intermittently.  It is important to prevent constipation and drink adequate amounts of liquids. You can stop taking this medication when you are not taking pain medication and you are back on your normal bowel routine.  Risks of Surgery Risks of surgery are low but include bleeding, infection, damage to surrounding structures, re-operation, blood clots, and very rarely death.   Blood Transfusion Information (For the consent to be signed before surgery)  We will be checking your blood type before surgery so in case of emergencies, we will know what type of blood you would need.                                            WHAT IS A BLOOD TRANSFUSION?  A transfusion is the replacement of blood or some of its parts. Blood is made up of multiple cells which  provide different functions. Red blood cells carry oxygen and are used for blood loss replacement. White blood cells fight against infection. Platelets control bleeding. Plasma helps clot blood. Other blood products are available for specialized needs, such as hemophilia or other clotting disorders. BEFORE THE TRANSFUSION  Who gives blood for transfusions?  You may be able to donate blood to be used at a later date on yourself (autologous donation). Relatives can be asked to donate blood. This is generally not any safer than if you have received blood from a stranger. The same precautions are taken to ensure safety when a relative's blood is donated. Healthy volunteers who are fully evaluated to make sure their blood is safe. This is blood bank blood. Transfusion therapy is the safest it has ever been in the practice of medicine.  Before blood is taken from a donor, a complete history is taken to make sure that person has no history of diseases nor engages in risky social behavior (examples are intravenous drug use or sexual activity with multiple partners). The donor's travel history is screened to minimize risk of transmitting infections, such as malaria. The donated blood is tested for signs of infectious diseases, such as HIV and hepatitis. The blood is then tested to be sure it is compatible with you in order to minimize the chance of a transfusion reaction. If you or a relative donates blood, this is often done in anticipation of surgery and is not appropriate for emergency situations. It takes many days to process the donated blood. RISKS AND COMPLICATIONS Although transfusion therapy is very safe and saves many lives, the main dangers of transfusion include:  Getting an infectious disease. Developing a transfusion reaction. This is an allergic reaction to something in the blood you were given. Every precaution is taken to prevent this. The decision to have a blood transfusion has been  considered carefully by your caregiver before blood is given. Blood is not given unless the benefits outweigh the risks.  AFTER SURGERY INSTRUCTIONS  Return to work: 4 weeks if applicable  Activity: 1. Be up and out of the bed during the day.  Take a nap if needed.  You may walk up steps but be careful and use the hand rail.  Stair climbing will tire you more than you think, you may need to stop part way and rest.   2. No lifting or straining for 6 weeks over 10 pounds. No pushing, pulling, straining for 6 weeks.  3. No driving for around 1 week(s).  Do not drive if you are taking narcotic pain medicine and make sure that your reaction time has returned.   4. You can shower as soon as the next day after surgery. Shower daily.  Use your regular soap and water (not directly on the incision) and pat your incision(s) dry afterwards; don't rub.  No tub baths or submerging your body in water until cleared by your surgeon. If you have the soap that was given to you by pre-surgical testing that was used before surgery, you do not need to use it afterwards because this can irritate your incisions.   5. No sexual activity and nothing in the vagina for 8 weeks.  6. You may experience a small amount of clear drainage from your incisions, which is normal.  If the drainage persists, increases, or changes color please call the office.  7. Do not use creams, lotions, or ointments such as neosporin on your incisions after surgery until advised by your surgeon because they can cause removal of the dermabond glue on your incisions.    8. You may experience vaginal spotting after surgery or around the 6-8 week mark from surgery when the stitches at the top of the vagina begin to dissolve.  The spotting is normal but if you experience heavy bleeding, call our office.  9. Take Tylenol or ibuprofen first for pain and only use narcotic pain medication for severe pain not relieved by the Tylenol or Ibuprofen.  Monitor  your Tylenol intake to a max of 4,000 mg in a 24 hour period. You can alternate these medications after surgery.  Diet: 1. Low sodium Heart Healthy Diet is recommended but you are cleared to resume your normal (before surgery) diet after your procedure.  2. It is safe to use a laxative,  such as Miralax or Colace, if you have difficulty moving your bowels. You have been prescribed Sennakot at bedtime every evening to keep bowel movements regular and to prevent constipation.    Wound Care: 1. Keep clean and dry.  Shower daily.  Reasons to call the Doctor: Fever - Oral temperature greater than 100.4 degrees Fahrenheit Foul-smelling vaginal discharge Difficulty urinating Nausea and vomiting Increased pain at the site of the incision that is unrelieved with pain medicine. Difficulty breathing with or without chest pain New calf pain especially if only on one side Sudden, continuing increased vaginal bleeding with or without clots.   Contacts: For questions or concerns you should contact:  Dr. Jeral Pinch at [phone]  Joylene John, NP at [phone]  After Hours: call [phone] and have the GYN Oncologist paged/contacted (after 5 pm or on the weekends).  Messages sent via mychart are for non-urgent matters and are not responded to after hours so for urgent needs, please call the after hours number.

## 2021-03-14 NOTE — Progress Notes (Signed)
Gynecologic Oncology Return Clinic Visit  03/14/2021  Reason for Visit: Discussion regarding next steps in work-up of abnormal uterine bleeding  Treatment History: 11/11/2020: I initially saw the patient as a new consult for abnormal uterine bleeding that I suspected was due to anovulatory bleeding.  She had previously had 2 attempts at endometrial biopsy in clinic which were unsuccessful.  We planned for endometrial sampling as an outpatient surgery. 11/15/2020: Patient presented for hysteroscopy and D&C with possible Mirena IUD insertion.  Unfortunately, she had a broncospasm during induction of anesthesia and I could not proceed with surgery. 11/30/2020: Patient preferred not to schedule any follow-up at this time to see how much she was going to be charged for aborted surgery.  She was amenable to starting progesterone.  Micronor was sent to her pharmacy.  Interval History: The patient presents today for follow-up and significant pain and distress.  When she initially presents, she declines having her vital signs taken or answering questions from our nurses.  Upon discussion with me, she reports that after our last MyChart exchange which was at the end of July, her bleeding (which had stopped) started again.  Since that time, she notes continued daily bleeding.  She is wearing always underwear and changes these 3 times a day.  They are mostly full when she changes them.  She also endorses passage of clots.  She has horrible pelvic cramping with some relief after she passes clots.  She is tired of being in pain and bleeding and wishes to proceed with surgery.  She endorses regular bowel and bladder function.  She notes having a good appetite without nausea or emesis.  Past Medical/Surgical History: Past Medical History:  Diagnosis Date   Abnormal uterine bleeding (AUB)    Frequent urination    GERD (gastroesophageal reflux disease)    IBS (irritable bowel syndrome)    Sleep apnea    Does not use  CPAP diagnosed ~ 10 years   Stroke [facility]) 2002   No residual effects thought to be secondary to Depo Provera use    Past Surgical History:  Procedure Laterality Date   CHOLECYSTECTOMY N/A 11/07/2012   Procedure: LAPAROSCOPIC CHOLECYSTECTOMY WITH INTRAOPERATIVE CHOLANGIOGRAM;  Surgeon: Wilmon Arms. Corliss Skains, MD;  Location: [facility] OR;  Service: General;  Laterality: N/A;   LAPAROSCOPIC TUBAL LIGATION Bilateral 06/29/2015   Procedure: LAPAROSCOPIC BILATERAL TUBAL LIGATION;  Surgeon: Myna Hidalgo, DO;  Location: [facility];  Service: Gynecology;  Laterality: Bilateral;   LEEP  2000    Family History  Problem Relation Age of Onset   Breast cancer Paternal Aunt    Colon cancer Neg Hx    Ovarian cancer Neg Hx    Endometrial cancer Neg Hx    Pancreatic cancer Neg Hx    Prostate cancer Neg Hx     Social History   Socioeconomic History   Marital status: Divorced    Spouse name: Not on file   Number of children: Not on file   Years of education: Not on file   Highest education level: Not on file  Occupational History   Not on file  Tobacco Use   Smoking status: Never   Smokeless tobacco: Never  Vaping Use   Vaping Use: Never used  Substance and Sexual Activity   Alcohol use: Never   Drug use: No   Sexual activity: Not Currently    Birth control/protection: Surgical  Other Topics Concern   Not on file  Social History Narrative   Not on file  Social Determinants of Health   Financial Resource Strain: Not on file  Food Insecurity: Not on file  Transportation Needs: Not on file  Physical Activity: Not on file  Stress: Not on file  Social Connections: Not on file    Current Medications:  Current Outpatient Medications:    medroxyPROGESTERone (PROVERA) 5 MG tablet, Take 1 tablet (5 mg total) by mouth daily. Take two tablets daily to start. Once bleeding slows down significantly or stops, you can decrease to one tablet a day., Disp: 40 tablet, Rfl: 1   Physical Exam: BP (!) 162/79  (BP Location: Left Arm, Patient Position: Sitting)   Pulse 82   Resp 16   SpO2 97%  General: Patient hunched over with her head on the counter most of the visit, moaning at times, tearful at times.  Laboratory & Radiologic Studies: None new  Assessment & Plan: Christine Bernard is a 50 y.o. woman with abnormal uterine bleeding, suspected to be anovulatory, in the setting of significant obesity with failed attempts at endometrial sampling.  The patient and I had plan to discuss how to proceed with regards to endometrial sampling today.  Unfortunately, she had not contacted my office or sent me a MyChart message to let me know the bleeding and pain that she has been in for the last couple of weeks.  Today, she is quite frustrated and wanting to proceed with a hysterectomy.  I explained that this cannot be done emergently and that the earliest date that I could plan for her surgery would be next Thursday.  My preference would be to postpone the surgery until September as I leave the country for over a week starting next Friday.  I would like to be here in the postoperative period in case the patient has any complications related to surgery.  We had previously discussed the possibility, especially after her bronchospasm and aborted attempts at [facility] in the operating room, of a staged procedure.  This would include a D&C to begin with with frozen section.  If no cancer identified, then we would proceed with a robotic total hysterectomy and bilateral salpingectomy.  If uterine cancer identified, then would consider removal of 1 or both ovaries as well as lymph node assessment if feasible.  We discussed the surgery in detail as well as the risks of surgery related to her prior medical history as well as her weight.  We discussed the small but possible risk that she does not tolerate Trendelenburg sufficiently to be able to proceed with robotic surgery.  If this is the case, I do not recommend open surgery  for her hysterectomy given her weight.  My recommendation, whether there is cancer or not, would be to proceed with a D&C and Mirena IUD insertion.  She does not want a Mirena IUD placed at the time of surgery and would prefer instead to be on oral progesterone, even if she has a precancer or cancer diagnosis.  This would be with the understanding of working on weight loss to hopefully 1 day achieve the ability to move forward with surgery.  I discussed expected recovery time as well as restrictions.  We reviewed that complications related to surgery include but are not limited to bleeding, need for blood transfusion, infection, damage to surrounding structures requiring repair (including bowel, bladder, blood vessels, nerves, and ureters), vaginal cuff separation, hernia development, medical complications (including stroke, heart attack, pneumonia, and rarely death), and VTE.  Given her significant distress today, the decision  was made to give her the perioperative written instructions.  Warner Mccreedy, my nurse practitioner, will plan to have a phone call visit with her once she is hopefully feeling better.  From a bleeding perspective, I suggested that we stop her progesterone birth control pill.  I have sent a prescription in for Provera which I would like her to start.  My hope is that this will slow down or stop her bleeding.  I will also plan to order an ultrasound to scheduled prior to surgery.  A CBC will be checked today to make sure that she has not developed significant anemia secondary to her ongoing bleeding.  44 minutes of total time was spent for this patient encounter, including preparation, face-to-face counseling with the patient and coordination of care, and documentation of the encounter.  Eugene Garnet, MD  Division of Gynecologic Oncology  Department of Obstetrics and Gynecology  [facility]

## 2021-03-14 NOTE — H&P (View-Only) (Signed)
Gynecologic Oncology Return Clinic Visit  03/14/2021  Reason for Visit: Discussion regarding next steps in work-up of abnormal uterine bleeding  Treatment History: 11/11/2020: I initially saw the patient as a new consult for abnormal uterine bleeding that I suspected was due to anovulatory bleeding.  She had previously had 2 attempts at endometrial biopsy in clinic which were unsuccessful.  We planned for endometrial sampling as an outpatient surgery. 11/15/2020: Patient presented for hysteroscopy and D&C with possible Mirena IUD insertion.  Unfortunately, she had a broncospasm during induction of anesthesia and I could not proceed with surgery. 11/30/2020: Patient preferred not to schedule any follow-up at this time to see how much she was going to be charged for aborted surgery.  She was amenable to starting progesterone.  Micronor was sent to her pharmacy.  Interval History: The patient presents today for follow-up and significant pain and distress.  When she initially presents, she declines having her vital signs taken or answering questions from our nurses.  Upon discussion with me, she reports that after our last MyChart exchange which was at the end of July, her bleeding (which had stopped) started again.  Since that time, she notes continued daily bleeding.  She is wearing always underwear and changes these 3 times a day.  They are mostly full when she changes them.  She also endorses passage of clots.  She has horrible pelvic cramping with some relief after she passes clots.  She is tired of being in pain and bleeding and wishes to proceed with surgery.  She endorses regular bowel and bladder function.  She notes having a good appetite without nausea or emesis.  Past Medical/Surgical History: Past Medical History:  Diagnosis Date   Abnormal uterine bleeding (AUB)    Frequent urination    GERD (gastroesophageal reflux disease)    IBS (irritable bowel syndrome)    Sleep apnea    Does not use  CPAP diagnosed ~ 10 years   Stroke [facility]) 2002   No residual effects thought to be secondary to Depo Provera use    Past Surgical History:  Procedure Laterality Date   CHOLECYSTECTOMY N/A 11/07/2012   Procedure: LAPAROSCOPIC CHOLECYSTECTOMY WITH INTRAOPERATIVE CHOLANGIOGRAM;  Surgeon: Wilmon Arms. Corliss Skains, MD;  Location: [facility] OR;  Service: General;  Laterality: N/A;   LAPAROSCOPIC TUBAL LIGATION Bilateral 06/29/2015   Procedure: LAPAROSCOPIC BILATERAL TUBAL LIGATION;  Surgeon: Myna Hidalgo, DO;  Location: [facility];  Service: Gynecology;  Laterality: Bilateral;   LEEP  2000    Family History  Problem Relation Age of Onset   Breast cancer Paternal Aunt    Colon cancer Neg Hx    Ovarian cancer Neg Hx    Endometrial cancer Neg Hx    Pancreatic cancer Neg Hx    Prostate cancer Neg Hx     Social History   Socioeconomic History   Marital status: Divorced    Spouse name: Not on file   Number of children: Not on file   Years of education: Not on file   Highest education level: Not on file  Occupational History   Not on file  Tobacco Use   Smoking status: Never   Smokeless tobacco: Never  Vaping Use   Vaping Use: Never used  Substance and Sexual Activity   Alcohol use: Never   Drug use: No   Sexual activity: Not Currently    Birth control/protection: Surgical  Other Topics Concern   Not on file  Social History Narrative   Not on file  Social Determinants of Health   Financial Resource Strain: Not on file  Food Insecurity: Not on file  Transportation Needs: Not on file  Physical Activity: Not on file  Stress: Not on file  Social Connections: Not on file    Current Medications:  Current Outpatient Medications:    medroxyPROGESTERone (PROVERA) 5 MG tablet, Take 1 tablet (5 mg total) by mouth daily. Take two tablets daily to start. Once bleeding slows down significantly or stops, you can decrease to one tablet a day., Disp: 40 tablet, Rfl: 1   Physical Exam: BP (!) 162/79  (BP Location: Left Arm, Patient Position: Sitting)   Pulse 82   Resp 16   SpO2 97%  General: Patient hunched over with her head on the counter most of the visit, moaning at times, tearful at times.  Laboratory & Radiologic Studies: None new  Assessment & Plan: Christine Bernard is a 50 y.o. woman with abnormal uterine bleeding, suspected to be anovulatory, in the setting of significant obesity with failed attempts at endometrial sampling.  The patient and I had plan to discuss how to proceed with regards to endometrial sampling today.  Unfortunately, she had not contacted my office or sent me a MyChart message to let me know the bleeding and pain that she has been in for the last couple of weeks.  Today, she is quite frustrated and wanting to proceed with a hysterectomy.  I explained that this cannot be done emergently and that the earliest date that I could plan for her surgery would be next Thursday.  My preference would be to postpone the surgery until September as I leave the country for over a week starting next Friday.  I would like to be here in the postoperative period in case the patient has any complications related to surgery.  We had previously discussed the possibility, especially after her bronchospasm and aborted attempts at [facility] in the operating room, of a staged procedure.  This would include a D&C to begin with with frozen section.  If no cancer identified, then we would proceed with a robotic total hysterectomy and bilateral salpingectomy.  If uterine cancer identified, then would consider removal of 1 or both ovaries as well as lymph node assessment if feasible.  We discussed the surgery in detail as well as the risks of surgery related to her prior medical history as well as her weight.  We discussed the small but possible risk that she does not tolerate Trendelenburg sufficiently to be able to proceed with robotic surgery.  If this is the case, I do not recommend open surgery  for her hysterectomy given her weight.  My recommendation, whether there is cancer or not, would be to proceed with a D&C and Mirena IUD insertion.  She does not want a Mirena IUD placed at the time of surgery and would prefer instead to be on oral progesterone, even if she has a precancer or cancer diagnosis.  This would be with the understanding of working on weight loss to hopefully 1 day achieve the ability to move forward with surgery.  I discussed expected recovery time as well as restrictions.  We reviewed that complications related to surgery include but are not limited to bleeding, need for blood transfusion, infection, damage to surrounding structures requiring repair (including bowel, bladder, blood vessels, nerves, and ureters), vaginal cuff separation, hernia development, medical complications (including stroke, heart attack, pneumonia, and rarely death), and VTE.  Given her significant distress today, the decision  was made to give her the perioperative written instructions.  Warner Mccreedy, my nurse practitioner, will plan to have a phone call visit with her once she is hopefully feeling better.  From a bleeding perspective, I suggested that we stop her progesterone birth control pill.  I have sent a prescription in for Provera which I would like her to start.  My hope is that this will slow down or stop her bleeding.  I will also plan to order an ultrasound to scheduled prior to surgery.  A CBC will be checked today to make sure that she has not developed significant anemia secondary to her ongoing bleeding.  44 minutes of total time was spent for this patient encounter, including preparation, face-to-face counseling with the patient and coordination of care, and documentation of the encounter.  Eugene Garnet, MD  Division of Gynecologic Oncology  Department of Obstetrics and Gynecology  [facility]

## 2021-03-15 ENCOUNTER — Other Ambulatory Visit: Payer: Self-pay | Admitting: Gynecologic Oncology

## 2021-03-15 ENCOUNTER — Encounter: Payer: Self-pay | Admitting: Gynecologic Oncology

## 2021-03-15 DIAGNOSIS — N939 Abnormal uterine and vaginal bleeding, unspecified: Secondary | ICD-10-CM

## 2021-03-15 NOTE — Progress Notes (Signed)
Preop ERAS gabapentin ordered.

## 2021-03-16 ENCOUNTER — Telehealth: Payer: Self-pay

## 2021-03-16 NOTE — Telephone Encounter (Signed)
Followed up with Christine Bernard regarding her upcoming surgery, ultrasound and labs. Offered to move her surgery to Thursday September 8th, she is agreeable to this. Notified her that labs will be drawn closer to the surgical date and her ultrasound has been cancelled.  Patient verbalized understanding and will call with any questions or concerns.

## 2021-03-16 NOTE — Progress Notes (Signed)
Christine Bernard  03/16/2021 / / Your procedure is scheduled on:         Report to [facility] Main  Entrance   Report to admitting at    0515am      Call this number if you have problems the morning of surgery [phone]    Remember: Do not eat food , candy gum or mints :After Midnight. You may have clear liquids from midnight until  0430am  Eat a light diet the day before surgery.  Avoid gas producing foods.    CLEAR LIQUID DIET   Foods Allowed                                                                       Coffee and tea, regular and decaf                              Plain Jell-O any favor except red or purple                                            Fruit ices (not with fruit pulp)                                      Iced Popsicles                                     Carbonated beverages, regular and diet                                    Cranberry, grape and apple juices Sports drinks like Gatorade Lightly seasoned clear broth or consume(fat free) Sugar, honey syrup   _____________________________________________________________________    BRUSH YOUR TEETH MORNING OF SURGERY AND RINSE YOUR MOUTH OUT, NO CHEWING GUM CANDY OR MINTS.     Take these medicines the morning of surgery with A SIP OF WATER:  none   DO NOT TAKE ANY DIABETIC MEDICATIONS DAY OF YOUR SURGERY                               You may not have any metal on your body including hair pins and              piercings  Do not wear jewelry, make-up, lotions, powders or perfumes, deodorant             Do not wear nail polish on your fingernails.  Do not shave  48 hours prior to surgery.              Men may shave face and neck.   Do not bring valuables to the hospital. [facility] IS NOT  RESPONSIBLE   FOR VALUABLES.  Contacts, dentures or bridgework may not be worn into surgery.  Leave suitcase in the car. After surgery it may be brought to your  room.     Patients discharged the day of surgery will not be allowed to drive home. IF YOU ARE HAVING SURGERY AND GOING HOME THE SAME DAY, YOU MUST HAVE AN ADULT TO DRIVE YOU HOME AND BE WITH YOU FOR 24 HOURS. YOU MAY GO HOME BY TAXI OR UBER OR ORTHERWISE, BUT AN ADULT MUST ACCOMPANY YOU HOME AND STAY WITH YOU FOR 24 HOURS.  Name and phone number of your driver:  Special Instructions: N/A              Please read over the following fact sheets you were given: _____________________________________________________________________  [facility] - Preparing for Surgery Before surgery, you can play an important role.  Because skin is not sterile, your skin needs to be as free of germs as possible.  You can reduce the number of germs on your skin by washing with CHG (chlorahexidine gluconate) soap before surgery.  CHG is an antiseptic cleaner which kills germs and bonds with the skin to continue killing germs even after washing. Please DO NOT use if you have an allergy to CHG or antibacterial soaps.  If your skin becomes reddened/irritated stop using the CHG and inform your nurse when you arrive at Short Stay. Do not shave (including legs and underarms) for at least 48 hours prior to the first CHG shower.  You may shave your face/neck. Please follow these instructions carefully:  1.  Shower with CHG Soap the night before surgery and the  morning of Surgery.  2.  If you choose to wash your hair, wash your hair first as usual with your  normal  shampoo.  3.  After you shampoo, rinse your hair and body thoroughly to remove the  shampoo.                           4.  Use CHG as you would any other liquid soap.  You can apply chg directly  to the skin and wash                       Gently with a scrungie or clean washcloth.  5.  Apply the CHG Soap to your body ONLY FROM THE NECK DOWN.   Do not use on face/ open                           Wound or open sores. Avoid contact with eyes, ears mouth and genitals  (private parts).                       Wash face,  Genitals (private parts) with your normal soap.             6.  Wash thoroughly, paying special attention to the area where your surgery  will be performed.  7.  Thoroughly rinse your body with warm water from the neck down.  8.  DO NOT shower/wash with your normal soap after using and rinsing off  the CHG Soap.                9.  Pat yourself dry with a clean towel.            10.  Wear clean pajamas.            11.  Place clean sheets on your bed the night of your first shower and do not  sleep with pets. Day of Surgery : Do not apply any lotions/deodorants the morning of surgery.  Please wear clean clothes to the hospital/surgery center.  FAILURE TO FOLLOW THESE INSTRUCTIONS MAY RESULT IN THE CANCELLATION OF YOUR SURGERY PATIENT SIGNATURE_________________________________  NURSE SIGNATURE__________________________________  ________________________________________________________________________

## 2021-03-17 ENCOUNTER — Ambulatory Visit (HOSPITAL_COMMUNITY): Payer: 59

## 2021-03-17 ENCOUNTER — Encounter (HOSPITAL_COMMUNITY): Payer: Self-pay

## 2021-03-20 ENCOUNTER — Telehealth: Payer: Self-pay

## 2021-03-20 ENCOUNTER — Ambulatory Visit: Payer: 59 | Admitting: Gynecologic Oncology

## 2021-03-20 NOTE — Telephone Encounter (Signed)
Spoke with Christine Bernard, her Pre-admission testing appointment has been rescheduled to 04/05/21 at 0900.  Patient verbalizes understanding and is in agreement of date and time.

## 2021-03-21 ENCOUNTER — Inpatient Hospital Stay (HOSPITAL_COMMUNITY)
Admission: RE | Admit: 2021-03-21 | Discharge: 2021-03-21 | Disposition: A | Discharge status: Home / Self Care | Payer: 59 | Source: Ambulatory Visit

## 2021-03-21 ENCOUNTER — Encounter (HOSPITAL_COMMUNITY): Payer: Self-pay

## 2021-03-23 DIAGNOSIS — N939 Abnormal uterine and vaginal bleeding, unspecified: Secondary | ICD-10-CM

## 2021-03-31 NOTE — Patient Instructions (Signed)
DUE TO COVID-19 ONLY ONE VISITOR IS ALLOWED TO COME WITH YOU AND STAY IN THE WAITING ROOM ONLY DURING PRE OP AND PROCEDURE.   **NO VISITORS ARE ALLOWED IN THE SHORT STAY AREA OR RECOVERY ROOM!!**  IF YOU WILL BE ADMITTED INTO THE HOSPITAL YOU ARE ALLOWED ONLY TWO SUPPORT PEOPLE DURING VISITATION HOURS ONLY (10AM -8PM)   The support person(s) may change daily. The support person(s) must pass our screening, gel in and out, and wear a mask at all times, including in the patient's room. Patients must also wear a mask when staff or their support person are in the room.  No visitors under the age of 39. Any visitor under the age of 73 must be accompanied by an adult.        Your procedure is scheduled on: 04/13/21   Report to [facility] Main  Entrance    Report to admitting at : 11:15 AM   Call this number if you have problems the morning of surgery [phone]  Eat a light diet the day before surgery.  Examples including soups, broths, toast, yogurt, mashed potatoes.  Things to avoid include carbonated beverages (fizzy beverages), raw fruits and raw vegetables, or beans.   If your bowels are filled with gas, your surgeon will have difficulty visualizing your pelvic organs which increases your surgical risks.    Do not eat food :After Midnight.   May have liquids until : 10:30 AM   day of surgery  CLEAR LIQUID DIET  Foods Allowed                                                                     Foods Excluded  Water, Black Coffee and tea, regular and decaf                             liquids that you cannot  Plain Jell-O in any flavor  (No red)                                           see through such as: Fruit ices (not with fruit pulp)                                     milk, soups, orange juice              Iced Popsicles (No red)                                    All solid food                                   Apple juices Sports drinks like Gatorade (No red) Lightly  seasoned clear broth or consume(fat free) Sugar  Sample Menu Breakfast  Lunch                                     Supper Cranberry juice                    Beef broth                            Chicken broth Jell-O                                     Grape juice                           Apple juice Coffee or tea                        Jell-O                                      Popsicle                                                Coffee or tea                        Coffee or tea        Oral Hygiene is also important to reduce your risk of infection.                                    Remember - BRUSH YOUR TEETH THE MORNING OF SURGERY WITH YOUR REGULAR TOOTHPASTE   Do NOT smoke after Midnight   Take these medicines the morning of surgery with A SIP OF WATER: N/A  DO NOT TAKE ANY ORAL DIABETIC MEDICATIONS DAY OF YOUR SURGERY                              You may not have any metal on your body including hair pins, jewelry, and body piercing             Do not wear make-up, lotions, powders, perfumes/cologne, or deodorant  Do not wear nail polish including gel and S&S, artificial/acrylic nails, or any other type of covering on natural nails including finger and toenails. If you have artificial nails, gel coating, etc. that needs to be removed by a nail salon please have this removed prior to surgery or surgery may need to be canceled/ delayed if the surgeon/ anesthesia feels like they are unable to be safely monitored.   Do not shave  48 hours prior to surgery.    Do not bring valuables to the hospital. [facility] IS NOT             RESPONSIBLE   FOR VALUABLES.   Contacts, dentures or bridgework may not be worn into surgery.   Bring small overnight bag day of surgery.  Patients discharged the day of surgery will not be allowed to drive home.   Special Instructions: Bring a copy of your healthcare power of attorney and living will documents          the day of surgery if you haven't scanned them in before.              Please read over the following fact sheets you were given: IF YOU HAVE QUESTIONS ABOUT YOUR PRE OP INSTRUCTIONS PLEASE CALL [phone]   [facility] - Preparing for Surgery Before surgery, you can play an important role.  Because skin is not sterile, your skin needs to be as free of germs as possible.  You can reduce the number of germs on your skin by washing with CHG (chlorahexidine gluconate) soap before surgery.  CHG is an antiseptic cleaner which kills germs and bonds with the skin to continue killing germs even after washing. Please DO NOT use if you have an allergy to CHG or antibacterial soaps.  If your skin becomes reddened/irritated stop using the CHG and inform your nurse when you arrive at Short Stay. Do not shave (including legs and underarms) for at least 48 hours prior to the first CHG shower.  You may shave your face/neck. Please follow these instructions carefully:  1.  Shower with CHG Soap the night before surgery and the  morning of Surgery.  2.  If you choose to wash your hair, wash your hair first as usual with your  normal  shampoo.  3.  After you shampoo, rinse your hair and body thoroughly to remove the  shampoo.                           4.  Use CHG as you would any other liquid soap.  You can apply chg directly  to the skin and wash                       Gently with a scrungie or clean washcloth.  5.  Apply the CHG Soap to your body ONLY FROM THE NECK DOWN.   Do not use on face/ open                           Wound or open sores. Avoid contact with eyes, ears mouth and genitals (private parts).                       Wash face,  Genitals (private parts) with your normal soap.             6.  Wash thoroughly, paying special attention to the area where your surgery  will be performed.  7.  Thoroughly rinse your body with warm water from the neck down.  8.  DO NOT shower/wash with your normal soap after  using and rinsing off  the CHG Soap.                9.  Pat yourself dry with a clean towel.            10.  Wear clean pajamas.            11.  Place clean sheets on your bed the night of your first shower and do not  sleep with pets. Day of Surgery : Do not apply any lotions/deodorants the morning of surgery.  Please wear clean clothes to the hospital/surgery center.  FAILURE TO FOLLOW THESE INSTRUCTIONS MAY RESULT IN THE CANCELLATION OF YOUR SURGERY PATIENT SIGNATURE_________________________________  NURSE SIGNATURE__________________________________  ________________________________________________________________________

## 2021-04-05 ENCOUNTER — Encounter (HOSPITAL_COMMUNITY)
Admission: RE | Admit: 2021-04-05 | Discharge: 2021-04-05 | Disposition: A | Discharge status: Home / Self Care | Payer: 59 | Source: Ambulatory Visit | Attending: Gynecologic Oncology | Admitting: Gynecologic Oncology

## 2021-04-05 ENCOUNTER — Other Ambulatory Visit: Payer: Self-pay

## 2021-04-05 ENCOUNTER — Encounter (HOSPITAL_COMMUNITY): Payer: Self-pay

## 2021-04-05 DIAGNOSIS — Z01812 Encounter for preprocedural laboratory examination: Secondary | ICD-10-CM | POA: Diagnosis not present

## 2021-04-05 HISTORY — DX: Other complications of anesthesia, initial encounter: T88.59XA

## 2021-04-05 LAB — COMPREHENSIVE METABOLIC PANEL
ALT: 24 U/L (ref 0–44)
AST: 18 U/L (ref 15–41)
Albumin: 4 g/dL (ref 3.5–5.0)
Alkaline Phosphatase: 58 U/L (ref 38–126)
Anion gap: 6 (ref 5–15)
BUN: 12 mg/dL (ref 6–20)
CO2: 29 mmol/L (ref 22–32)
Calcium: 9.7 mg/dL (ref 8.9–10.3)
Chloride: 107 mmol/L (ref 98–111)
Creatinine, Ser: 0.72 mg/dL (ref 0.44–1.00)
GFR, Estimated: >60 mL/min (ref >60)
Glucose, Bld: 101 mg/dL — ABNORMAL HIGH (ref 70–99)
Potassium: 4.4 mmol/L (ref 3.5–5.1)
Sodium: 142 mmol/L (ref 135–145)
Total Bilirubin: 0.4 mg/dL (ref 0.3–1.2)
Total Protein: 8.5 g/dL — ABNORMAL HIGH (ref 6.5–8.1)

## 2021-04-05 LAB — CBC
HCT: 40.2 % (ref 36.0–46.0)
Hemoglobin: 12.4 g/dL (ref 12.0–15.0)
MCH: 28.4 pg (ref 26.0–34.0)
MCHC: 30.8 g/dL (ref 30.0–36.0)
MCV: 92.2 fL (ref 80.0–100.0)
Platelets: 280 10*3/uL (ref 150–400)
RBC: 4.36 MIL/uL (ref 3.87–5.11)
RDW: 13.4 % (ref 11.5–15.5)
WBC: 7.7 10*3/uL (ref 4.0–10.5)
nRBC: 0 % (ref 0.0–0.2)

## 2021-04-05 NOTE — Progress Notes (Addendum)
COVID Vaccine Completed: Yes Date COVID Vaccine completed: 09/02/20. X 3 COVID vaccine manufacturer: Pfizer     Covid test: N/A  PCP - Dr. Vianne Bulls Cardiologist - NO  Chest x-ray -  EKG - 11/14/20 EPIC Stress Test -  ECHO -  Cardiac Cath -  Pacemaker/ICD device last checked:  Sleep Study - Yes CPAP - NO  Fasting Blood Sugar -  Checks Blood Sugar _____ times a day  Blood Thinner Instructions: Aspirin Instructions: Last Dose:  Anesthesia review: Hx: Stroke,OSA(NO CPAP)  Patient denies shortness of breath, fever, cough and chest pain at PAT appointment   Patient verbalized understanding of instructions that were given to them at the PAT appointment. Patient was also instructed that they will need to review over the PAT instructions again at home before surgery.

## 2021-04-12 ENCOUNTER — Telehealth: Payer: Self-pay | Admitting: *Deleted

## 2021-04-12 ENCOUNTER — Encounter (HOSPITAL_COMMUNITY): Payer: Self-pay

## 2021-04-12 ENCOUNTER — Ambulatory Visit (HOSPITAL_COMMUNITY): Admit: 2021-04-12 | Payer: 59 | Admitting: Gastroenterology

## 2021-04-12 SURGERY — COLONOSCOPY WITH PROPOFOL
Anesthesia: Monitor Anesthesia Care

## 2021-04-12 NOTE — Progress Notes (Signed)
Called pt about time change for surgery on 04/13/21. Pt to arrive 1030 for 1230 surgery. She verbalizes understanding.

## 2021-04-12 NOTE — Telephone Encounter (Signed)
I spoke with Christine Bernard regarding her procedure tomorrow. She is compliant with preop diet. She plans to arrive at 11;15 tomorrow. No questions or concerns voiced at this time. She was encouraged to call the clinic with any further questions or concerns.

## 2021-04-13 ENCOUNTER — Ambulatory Visit (HOSPITAL_COMMUNITY)
Admission: RE | Admit: 2021-04-13 | Discharge: 2021-04-13 | Disposition: A | Discharge status: Home / Self Care | Payer: 59 | Source: Ambulatory Visit | Attending: Gynecologic Oncology | Admitting: Gynecologic Oncology

## 2021-04-13 ENCOUNTER — Encounter (HOSPITAL_COMMUNITY): Payer: Self-pay | Admitting: Gynecologic Oncology

## 2021-04-13 ENCOUNTER — Other Ambulatory Visit: Payer: Self-pay | Admitting: Gynecologic Oncology

## 2021-04-13 ENCOUNTER — Encounter (HOSPITAL_COMMUNITY)
Admission: RE | Disposition: A | Discharge status: Home / Self Care | Payer: Self-pay | Source: Ambulatory Visit | Attending: Gynecologic Oncology

## 2021-04-13 ENCOUNTER — Other Ambulatory Visit: Payer: Self-pay

## 2021-04-13 ENCOUNTER — Ambulatory Visit (HOSPITAL_COMMUNITY): Payer: 59 | Admitting: Certified Registered"

## 2021-04-13 ENCOUNTER — Ambulatory Visit (HOSPITAL_COMMUNITY): Payer: 59 | Admitting: Physician Assistant

## 2021-04-13 ENCOUNTER — Encounter: Payer: Self-pay | Admitting: Gynecologic Oncology

## 2021-04-13 DIAGNOSIS — N888 Other specified noninflammatory disorders of cervix uteri: Secondary | ICD-10-CM | POA: Insufficient documentation

## 2021-04-13 DIAGNOSIS — Z6841 Body Mass Index (BMI) 40.0 and over, adult: Secondary | ICD-10-CM | POA: Diagnosis not present

## 2021-04-13 DIAGNOSIS — E669 Obesity, unspecified: Secondary | ICD-10-CM | POA: Diagnosis not present

## 2021-04-13 DIAGNOSIS — N838 Other noninflammatory disorders of ovary, fallopian tube and broad ligament: Secondary | ICD-10-CM | POA: Insufficient documentation

## 2021-04-13 DIAGNOSIS — Z8673 Personal history of transient ischemic attack (TIA), and cerebral infarction without residual deficits: Secondary | ICD-10-CM | POA: Diagnosis not present

## 2021-04-13 DIAGNOSIS — N939 Abnormal uterine and vaginal bleeding, unspecified: Secondary | ICD-10-CM

## 2021-04-13 DIAGNOSIS — N83201 Unspecified ovarian cyst, right side: Secondary | ICD-10-CM | POA: Diagnosis not present

## 2021-04-13 DIAGNOSIS — N800 Endometriosis of uterus: Secondary | ICD-10-CM | POA: Insufficient documentation

## 2021-04-13 DIAGNOSIS — Z793 Long term (current) use of hormonal contraceptives: Secondary | ICD-10-CM | POA: Insufficient documentation

## 2021-04-13 HISTORY — PX: OOPHORECTOMY: SHX6387

## 2021-04-13 HISTORY — PX: ROBOTIC ASSISTED TOTAL HYSTERECTOMY: SHX6085

## 2021-04-13 HISTORY — PX: DILATION AND CURETTAGE OF UTERUS: SHX78

## 2021-04-13 LAB — ABO/RH: ABO/RH(D): O POS

## 2021-04-13 LAB — TYPE AND SCREEN
ABO/RH(D): O POS
Antibody Screen: NEGATIVE

## 2021-04-13 LAB — PREGNANCY, URINE: Preg Test, Ur: NEGATIVE

## 2021-04-13 SURGERY — HYSTERECTOMY, TOTAL, ROBOT-ASSISTED
Anesthesia: General

## 2021-04-13 MED ORDER — PROPOFOL 10 MG/ML IV BOLUS
INTRAVENOUS | Status: DC | PRN
  Administered 2021-04-13: 200 mg via INTRAVENOUS

## 2021-04-13 MED ORDER — DEXAMETHASONE SODIUM PHOSPHATE 10 MG/ML IJ SOLN
INTRAMUSCULAR | Status: DC | PRN
  Administered 2021-04-13: 8 mg via INTRAVENOUS

## 2021-04-13 MED ORDER — ROCURONIUM BROMIDE 10 MG/ML (PF) SYRINGE
PREFILLED_SYRINGE | INTRAVENOUS | Status: DC | PRN
  Administered 2021-04-13: 30 mg via INTRAVENOUS
  Administered 2021-04-13: 100 mg via INTRAVENOUS

## 2021-04-13 MED ORDER — HYDROMORPHONE HCL 1 MG/ML IJ SOLN
0.2500 mg | INTRAMUSCULAR | Status: DC | PRN
  Administered 2021-04-13 (×2): 0.5 mg via INTRAVENOUS

## 2021-04-13 MED ORDER — ONDANSETRON HCL 4 MG/2ML IJ SOLN
INTRAMUSCULAR | Status: DC | PRN
  Administered 2021-04-13: 4 mg via INTRAVENOUS

## 2021-04-13 MED ORDER — SCOPOLAMINE 1 MG/3DAYS TD PT72
1.0000 | MEDICATED_PATCH | TRANSDERMAL | Status: DC
  Administered 2021-04-13: 1.5 mg via TRANSDERMAL
  Filled 2021-04-13: qty 1

## 2021-04-13 MED ORDER — MEPERIDINE HCL 50 MG/ML IJ SOLN: 6.2500 mg | INTRAMUSCULAR | Status: DC | PRN

## 2021-04-13 MED ORDER — DEXAMETHASONE SODIUM PHOSPHATE 4 MG/ML IJ SOLN: 4.0000 mg | INTRAMUSCULAR | Status: DC

## 2021-04-13 MED ORDER — HEPARIN SODIUM (PORCINE) 5000 UNIT/ML IJ SOLN
5000.0000 [IU] | INTRAMUSCULAR | Status: AC
  Administered 2021-04-13: 5000 [IU] via SUBCUTANEOUS
  Filled 2021-04-13: qty 1

## 2021-04-13 MED ORDER — LACTATED RINGERS IV SOLN: INTRAVENOUS | Status: DC

## 2021-04-13 MED ORDER — CEFAZOLIN IN SODIUM CHLORIDE 3-0.9 GM/100ML-% IV SOLN
3.0000 g | INTRAVENOUS | Status: AC
  Administered 2021-04-13: 3 g via INTRAVENOUS
  Filled 2021-04-13: qty 100

## 2021-04-13 MED ORDER — CELECOXIB 200 MG PO CAPS
200.0000 mg | ORAL_CAPSULE | ORAL | Status: AC
  Administered 2021-04-13: 200 mg via ORAL
  Filled 2021-04-13: qty 1

## 2021-04-13 MED ORDER — LIDOCAINE 2% (20 MG/ML) 5 ML SYRINGE
INTRAMUSCULAR | Status: AC
  Filled 2021-04-13: qty 5

## 2021-04-13 MED ORDER — FENTANYL CITRATE (PF) 100 MCG/2ML IJ SOLN
INTRAMUSCULAR | Status: AC
  Filled 2021-04-13: qty 2

## 2021-04-13 MED ORDER — SODIUM CHLORIDE 0.9% FLUSH: 3.0000 mL | Freq: Two times a day (BID) | INTRAVENOUS | Status: DC

## 2021-04-13 MED ORDER — MIDAZOLAM HCL 2 MG/2ML IJ SOLN
INTRAMUSCULAR | Status: DC | PRN
Start: 2021-04-13 — End: 2021-04-13
  Administered 2021-04-13: 2 mg via INTRAVENOUS

## 2021-04-13 MED ORDER — BUPIVACAINE HCL 0.25 % IJ SOLN
INTRAMUSCULAR | Status: AC
  Filled 2021-04-13: qty 1

## 2021-04-13 MED ORDER — ROCURONIUM BROMIDE 10 MG/ML (PF) SYRINGE
PREFILLED_SYRINGE | INTRAVENOUS | Status: AC
  Filled 2021-04-13: qty 10

## 2021-04-13 MED ORDER — ORAL CARE MOUTH RINSE: 15.0000 mL | Freq: Once | OROMUCOSAL | Status: AC

## 2021-04-13 MED ORDER — MIDAZOLAM HCL 2 MG/2ML IJ SOLN
INTRAMUSCULAR | Status: AC
  Filled 2021-04-13: qty 2

## 2021-04-13 MED ORDER — PHENYLEPHRINE 40 MCG/ML (10ML) SYRINGE FOR IV PUSH (FOR BLOOD PRESSURE SUPPORT)
PREFILLED_SYRINGE | INTRAVENOUS | Status: DC | PRN
  Administered 2021-04-13 (×3): 160 ug via INTRAVENOUS

## 2021-04-13 MED ORDER — PROPOFOL 10 MG/ML IV BOLUS
INTRAVENOUS | Status: AC
  Filled 2021-04-13: qty 40

## 2021-04-13 MED ORDER — AMISULPRIDE (ANTIEMETIC) 5 MG/2ML IV SOLN: 10.0000 mg | Freq: Once | INTRAVENOUS | Status: DC | PRN

## 2021-04-13 MED ORDER — TRAMADOL HCL 50 MG PO TABS: 50.0000 mg | ORAL_TABLET | Freq: Four times a day (QID) | ORAL | 0 refills | Status: DC | PRN

## 2021-04-13 MED ORDER — IBUPROFEN 800 MG PO TABS: 800.0000 mg | ORAL_TABLET | Freq: Three times a day (TID) | ORAL | 0 refills | Status: DC | PRN

## 2021-04-13 MED ORDER — PROMETHAZINE HCL 25 MG/ML IJ SOLN: 6.2500 mg | INTRAMUSCULAR | Status: DC | PRN

## 2021-04-13 MED ORDER — LACTATED RINGERS IR SOLN
Status: DC | PRN
  Administered 2021-04-13: 1

## 2021-04-13 MED ORDER — SENNA 8.6 MG PO TABS: 1.0000 | ORAL_TABLET | Freq: Every day | ORAL | 0 refills | Status: DC

## 2021-04-13 MED ORDER — STERILE WATER FOR INJECTION IJ SOLN
INTRAMUSCULAR | Status: AC
  Filled 2021-04-13: qty 10

## 2021-04-13 MED ORDER — DEXAMETHASONE SODIUM PHOSPHATE 10 MG/ML IJ SOLN
INTRAMUSCULAR | Status: AC
  Filled 2021-04-13: qty 1

## 2021-04-13 MED ORDER — PHENYLEPHRINE HCL-NACL 20-0.9 MG/250ML-% IV SOLN
INTRAVENOUS | Status: DC | PRN
  Administered 2021-04-13: 25 ug/min via INTRAVENOUS

## 2021-04-13 MED ORDER — SUGAMMADEX SODIUM 200 MG/2ML IV SOLN
INTRAVENOUS | Status: DC | PRN
  Administered 2021-04-13: 280 mg via INTRAVENOUS

## 2021-04-13 MED ORDER — HYDROMORPHONE HCL 1 MG/ML IJ SOLN
INTRAMUSCULAR | Status: AC
  Filled 2021-04-13: qty 1

## 2021-04-13 MED ORDER — ONDANSETRON HCL 4 MG/2ML IJ SOLN
INTRAMUSCULAR | Status: AC
  Filled 2021-04-13: qty 2

## 2021-04-13 MED ORDER — OXYCODONE HCL 5 MG/5ML PO SOLN: 5.0000 mg | Freq: Once | ORAL | Status: DC | PRN

## 2021-04-13 MED ORDER — FENTANYL CITRATE (PF) 100 MCG/2ML IJ SOLN
INTRAMUSCULAR | Status: DC | PRN
  Administered 2021-04-13 (×2): 25 ug via INTRAVENOUS
  Administered 2021-04-13: 100 ug via INTRAVENOUS
  Administered 2021-04-13: 50 ug via INTRAVENOUS

## 2021-04-13 MED ORDER — LIDOCAINE 2% (20 MG/ML) 5 ML SYRINGE
INTRAMUSCULAR | Status: DC | PRN
Start: 2021-04-13 — End: 2021-04-13
  Administered 2021-04-13: 100 mg via INTRAVENOUS

## 2021-04-13 MED ORDER — ACETAMINOPHEN 500 MG PO TABS
1000.0000 mg | ORAL_TABLET | ORAL | Status: AC
  Administered 2021-04-13: 1000 mg via ORAL
  Filled 2021-04-13: qty 2

## 2021-04-13 MED ORDER — CHLORHEXIDINE GLUCONATE 0.12 % MT SOLN
15.0000 mL | Freq: Once | OROMUCOSAL | Status: AC
  Administered 2021-04-13: 15 mL via OROMUCOSAL

## 2021-04-13 MED ORDER — SUGAMMADEX SODIUM 500 MG/5ML IV SOLN
INTRAVENOUS | Status: AC
  Filled 2021-04-13: qty 5

## 2021-04-13 MED ORDER — STERILE WATER FOR IRRIGATION IR SOLN
Status: DC | PRN
  Administered 2021-04-13: 1000 mL

## 2021-04-13 MED ORDER — BUPIVACAINE HCL 0.25 % IJ SOLN
INTRAMUSCULAR | Status: DC | PRN
  Administered 2021-04-13: 39 mL

## 2021-04-13 MED ORDER — OXYCODONE HCL 5 MG PO TABS: 5.0000 mg | ORAL_TABLET | Freq: Once | ORAL | Status: DC | PRN

## 2021-04-13 MED ORDER — GABAPENTIN 300 MG PO CAPS
300.0000 mg | ORAL_CAPSULE | ORAL | Status: AC
  Administered 2021-04-13: 300 mg via ORAL
  Filled 2021-04-13: qty 1

## 2021-04-13 MED ORDER — PHENYLEPHRINE HCL (PRESSORS) 10 MG/ML IV SOLN
INTRAVENOUS | Status: AC
  Filled 2021-04-13: qty 2

## 2021-04-13 SURGICAL SUPPLY — 88 items
ADH SKN CLS APL DERMABOND .7 (GAUZE/BANDAGES/DRESSINGS) ×2
AGENT HMST KT MTR STRL THRMB (HEMOSTASIS)
APL ESCP 34 STRL LF DISP (HEMOSTASIS)
APPLICATOR SURGIFLO ENDO (HEMOSTASIS) IMPLANT
BAG COUNTER SPONGE SURGICOUNT (BAG) IMPLANT
BAG LAPAROSCOPIC 12 15 PORT 16 (BASKET) IMPLANT
BAG RETRIEVAL 12/15 (BASKET) ×3
BAG SPEC RTRVL LRG 6X4 10 (ENDOMECHANICALS)
BAG SPNG CNTER NS LX DISP (BAG)
BLADE SURG SZ10 CARB STEEL (BLADE) IMPLANT
CATH ROBINSON RED A/P 16FR (CATHETERS) ×3 IMPLANT
CNTNR URN SCR LID CUP LEK RST (MISCELLANEOUS) IMPLANT
CONT SPEC 4OZ STRL OR WHT (MISCELLANEOUS) ×3
COVER BACK TABLE 60X90IN (DRAPES) ×3 IMPLANT
COVER TIP SHEARS 8 DVNC (MISCELLANEOUS) ×2 IMPLANT
COVER TIP SHEARS 8MM DA VINCI (MISCELLANEOUS) ×3
DECANTER SPIKE VIAL GLASS SM (MISCELLANEOUS) IMPLANT
DERMABOND ADVANCED (GAUZE/BANDAGES/DRESSINGS) ×1
DERMABOND ADVANCED .7 DNX12 (GAUZE/BANDAGES/DRESSINGS) ×2 IMPLANT
DRAPE ARM DVNC X/XI (DISPOSABLE) ×8 IMPLANT
DRAPE COLUMN DVNC XI (DISPOSABLE) ×2 IMPLANT
DRAPE DA VINCI XI ARM (DISPOSABLE) ×12
DRAPE DA VINCI XI COLUMN (DISPOSABLE) ×3
DRAPE SHEET LG 3/4 BI-LAMINATE (DRAPES) ×3 IMPLANT
DRAPE SURG IRRIG POUCH 19X23 (DRAPES) ×3 IMPLANT
DRAPE UNDERBUTTOCKS STRL (DISPOSABLE) ×3 IMPLANT
DRSG OPSITE POSTOP 4X6 (GAUZE/BANDAGES/DRESSINGS) IMPLANT
DRSG OPSITE POSTOP 4X8 (GAUZE/BANDAGES/DRESSINGS) IMPLANT
DRSG TELFA 3X8 NADH (GAUZE/BANDAGES/DRESSINGS) ×6 IMPLANT
ELECT PENCIL ROCKER SW 15FT (MISCELLANEOUS) IMPLANT
ELECT REM PT RETURN 15FT ADLT (MISCELLANEOUS) ×3 IMPLANT
GAUZE 4X4 16PLY ~~LOC~~+RFID DBL (SPONGE) ×3 IMPLANT
GLOVE SURG ENC MOIS LTX SZ6 (GLOVE) ×12 IMPLANT
GLOVE SURG ENC MOIS LTX SZ6.5 (GLOVE) ×6 IMPLANT
GOWN STRL REUS W/ TWL LRG LVL3 (GOWN DISPOSABLE) ×8 IMPLANT
GOWN STRL REUS W/TWL LRG LVL3 (GOWN DISPOSABLE) ×12
HOLDER FOLEY CATH W/STRAP (MISCELLANEOUS) ×3 IMPLANT
IRRIG SUCT STRYKERFLOW 2 WTIP (MISCELLANEOUS) ×3
IRRIGATION SUCT STRKRFLW 2 WTP (MISCELLANEOUS) ×2 IMPLANT
KIT BASIN OR (CUSTOM PROCEDURE TRAY) ×3 IMPLANT
KIT PROCEDURE DA VINCI SI (MISCELLANEOUS)
KIT PROCEDURE DVNC SI (MISCELLANEOUS) IMPLANT
KIT TURNOVER KIT A (KITS) ×3 IMPLANT
MANIPULATOR ADVINCU DEL 3.5 PL (MISCELLANEOUS) ×1 IMPLANT
MANIPULATOR UTERINE 4.5 ZUMI (MISCELLANEOUS) ×3 IMPLANT
NDL HYPO 21X1.5 SAFETY (NEEDLE) ×2 IMPLANT
NDL SPNL 18GX3.5 QUINCKE PK (NEEDLE) IMPLANT
NDL SPNL 22GX3.5 QUINCKE BK (NEEDLE) ×2 IMPLANT
NEEDLE HYPO 21X1.5 SAFETY (NEEDLE) ×3 IMPLANT
NEEDLE SPNL 18GX3.5 QUINCKE PK (NEEDLE) IMPLANT
NEEDLE SPNL 22GX3.5 QUINCKE BK (NEEDLE) ×3 IMPLANT
NS IRRIG 1000ML POUR BTL (IV SOLUTION) ×3 IMPLANT
OBTURATOR OPTICAL STANDARD 8MM (TROCAR) ×3
OBTURATOR OPTICAL STND 8 DVNC (TROCAR) ×2
OBTURATOR OPTICALSTD 8 DVNC (TROCAR) ×2 IMPLANT
PACK LITHOTOMY IV (CUSTOM PROCEDURE TRAY) ×3 IMPLANT
PACK ROBOT GYN CUSTOM WL (TRAY / TRAY PROCEDURE) ×3 IMPLANT
PAD DRESSING TELFA 3X8 NADH (GAUZE/BANDAGES/DRESSINGS) ×2 IMPLANT
PAD OB MATERNITY 4.3X12.25 (PERSONAL CARE ITEMS) ×3 IMPLANT
PAD POSITIONING PINK XL (MISCELLANEOUS) ×3 IMPLANT
PENCIL SMOKE EVACUATOR (MISCELLANEOUS) IMPLANT
PORT ACCESS TROCAR AIRSEAL 12 (TROCAR) ×2 IMPLANT
PORT ACCESS TROCAR AIRSEAL 5M (TROCAR) ×1
POUCH SPECIMEN RETRIEVAL 10MM (ENDOMECHANICALS) IMPLANT
SCRUB EXIDINE 4% CHG 4OZ (MISCELLANEOUS) ×3 IMPLANT
SEAL CANN UNIV 5-8 DVNC XI (MISCELLANEOUS) ×8 IMPLANT
SEAL XI 5MM-8MM UNIVERSAL (MISCELLANEOUS) ×12
SET TRI-LUMEN FLTR TB AIRSEAL (TUBING) ×3 IMPLANT
SPONGE T-LAP 18X18 ~~LOC~~+RFID (SPONGE) IMPLANT
SURGIFLO W/THROMBIN 8M KIT (HEMOSTASIS) IMPLANT
SURGILUBE 2OZ TUBE FLIPTOP (MISCELLANEOUS) ×3 IMPLANT
SUT MNCRL AB 4-0 PS2 18 (SUTURE) IMPLANT
SUT PDS AB 1 TP1 96 (SUTURE) IMPLANT
SUT VIC AB 0 CT1 27 (SUTURE) ×3
SUT VIC AB 0 CT1 27XBRD ANTBC (SUTURE) IMPLANT
SUT VIC AB 2-0 CT1 27 (SUTURE)
SUT VIC AB 2-0 CT1 TAPERPNT 27 (SUTURE) IMPLANT
SUT VIC AB 4-0 PS2 18 (SUTURE) ×6 IMPLANT
SYR 10ML LL (SYRINGE) IMPLANT
SYR BULB IRRIG 60ML STRL (SYRINGE) ×3 IMPLANT
TOWEL OR 17X26 10 PK STRL BLUE (TOWEL DISPOSABLE) ×3 IMPLANT
TOWEL OR NON WOVEN STRL DISP B (DISPOSABLE) ×3 IMPLANT
TRAP SPECIMEN MUCUS 40CC (MISCELLANEOUS) IMPLANT
TRAY FOLEY MTR SLVR 16FR STAT (SET/KITS/TRAYS/PACK) ×3 IMPLANT
TROCAR XCEL NON-BLD 5MMX100MML (ENDOMECHANICALS) IMPLANT
UNDERPAD 30X36 HEAVY ABSORB (UNDERPADS AND DIAPERS) ×6 IMPLANT
WATER STERILE IRR 1000ML POUR (IV SOLUTION) ×3 IMPLANT
YANKAUER SUCT BULB TIP 10FT TU (MISCELLANEOUS) IMPLANT

## 2021-04-13 NOTE — Anesthesia Procedure Notes (Addendum)
Date/Time: 04/13/2021 12:40 PM Performed by: Lynda Rainwater, MD Pre-anesthesia Checklist: Patient identified and Emergency Drugs available Patient Re-evaluated:Patient Re-evaluated prior to induction Oxygen Delivery Method: Circle system utilized Preoxygenation: Pre-oxygenation with 100% oxygen Induction Type: IV induction Ventilation: Two handed mask ventilation required and Oral airway inserted - appropriate to patient size Laryngoscope Size: Mac and 4 Grade View: Grade II Tube type: Oral Tube size: 7.0 mm Number of attempts: 1 Airway Equipment and Method: Stylet Placement Confirmation: ETT inserted through vocal cords under direct vision, positive ETCO2 and breath sounds checked- equal and bilateral Secured at: 23 cm Tube secured with: Tape Dental Injury: Teeth and Oropharynx as per pre-operative assessment  Comments: DL by CRNA grade 2b-3 view. Large amount of secretions. DL by Dr. Sabra Heck, grade 2b view, passed tube.

## 2021-04-13 NOTE — Transfer of Care (Signed)
Immediate Anesthesia Transfer of Care Note  Patient: Christine Bernard  Procedure(s) Performed: XI ROBOTIC ASSISTED TOTAL HYSTERECTOMY WITH BILATERAL SALPINGECTOMY (Bilateral) DILATATION AND CURETTAGE RIGHT OVARIAN CYSTECTOMY (Bilateral)  Patient Location: PACU  Anesthesia Type:General  Level of Consciousness: awake, alert  and oriented  Airway & Oxygen Therapy: Patient Spontanous Breathing and Patient connected to face mask oxygen  Post-op Assessment: Report given to RN, Post -op Vital signs reviewed and stable and Patient moving all extremities X 4  Post vital signs: Reviewed and stable  Last Vitals:  Vitals Value Taken Time  BP 144/95 04/13/21 1450  Temp    Pulse 78 04/13/21 1451  Resp 15 04/13/21 1451  SpO2 97 % 04/13/21 1451  Vitals shown include unvalidated device data.  Last Pain:  Vitals:   04/13/21 1055  TempSrc: Oral  PainSc: 0-No pain      Patients Stated Pain Goal: 4 (04/13/21 1055)  Complications: No notable events documented.

## 2021-04-13 NOTE — Op Note (Addendum)
OPERATIVE NOTE  Pre-operative Diagnosis: abnormal uterine bleeding, unable to sample endometrium  Post-operative Diagnosis: same, benign endometrium on frozen section, suspected right ovarian endometrioma, endometriosis  Operation: D&C, robotic-assisted laparoscopic total hysterectomy with bilateral salpingectomy, right ovarian cystectomy   Surgeon: Eugene Garnet MD  Assistant Surgeon: Antionette Char MD (an MD assistant was necessary for tissue manipulation, management of robotic instrumentation, retraction and positioning due to the complexity of the case and hospital policies).  Modifier 22: Extreme morbid obesity requiring additional OR personnel for positioning and retraction. Obesity made retroperitoneal visualization limited and increased the complexity of the case and necessitated additional instrumentation for retraction. Obesity related complexity increased the duration of the procedure by 30 minutes.   Anesthesia: GET  Urine Output: 75cc  Operative Findings: 8-10cm mobile uterus on EUA. On intra-abdominal entry, normal upper abdominal survey. Normal omentum, small and large bowel. Sigmoid epiploica adherent to the left uterine fundus either at site of endometriosis implant or diverticula. 2cm right ovarian cyst with chocolate thick fluid on cystectomy. Ovaries otherwise normal appearing. Bilateral fallopian tubes missing mid and proximal portion of tube. Uterus 10cm, bulbous at fundus. Some adhesions between the cervix and bladder.  Estimated Blood Loss:  less than 100 mL      Total IV Fluids: see I&O flowsheet         Specimens: uterus, cervix, bilateral tubes, right ovarian cyst         Complications:  None apparent; patient tolerated the procedure well.         Disposition: PACU - hemodynamically stable.  Procedure Details  The patient was seen in the Holding Room. The risks, benefits, complications, treatment options, and expected outcomes were discussed with the  patient.  The patient concurred with the proposed plan, giving informed consent.  The site of surgery properly noted/marked. The patient was identified as Christine Bernard and the procedure verified as a Robotic-assisted hysterectomy with bilateral salpingectomy, D&C, other indicated procedures.   After induction of anesthesia, the patient was draped and prepped in the usual sterile manner. Patient was placed in supine position after anesthesia and draped and prepped in the usual sterile manner as follows: Her arms were tucked to her side with all appropriate precautions.  The shoulders were stabilized with padded shoulder blocks applied to the acromium processes.  The patient was placed in the semi-lithotomy position in [place] stirrups.  The perineum and vagina were prepped with CholoraPrep. The patient was draped after the CholoraPrep had been allowed to dry for 3 minutes.  A Time Out was held and the above information confirmed.  The urethra was prepped with Betadine. Foley catheter was placed.  A sterile speculum was placed in the vagina.  The cervix was grasped with a single-tooth tenaculum. The cervix was dilated with Shawnie Pons dilators.  A D&C was performed and the specimen sent for frozen. The 3.5 Delineator uterine manipulator with a colpotomizer ring was placed without difficulty.  A pneum occluder balloon was placed over the manipulator.  OG tube placement was confirmed and to suction.   Next, a 10 mm skin incision was made 1 cm below the subcostal margin in the midclavicular line.  The 5 mm Optiview port and scope was used for direct entry.  Opening pressure was under 10 mm CO2.  The abdomen was insufflated and the findings were noted as above.   At this point and all points during the procedure, the patient's intra-abdominal pressure did not exceed 15 mmHg. Next, an 8 mm skin  incision was made superior to the umbilicus and a right and left port were placed about 8 cm lateral to the robot port on the right  and left side.  A fourth arm was placed on the right.  The 5 mm assist trocar was exchanged for a 10-12 mm port. All ports were placed under direct visualization.  The patient was placed in steep Trendelenburg.  Bowel was folded away into the upper abdomen.  The robot was docked in the normal manner.  The right and left peritoneum were opened parallel to the IP ligament to open the retroperitoneal spaces bilaterally. The round ligaments were transected. The ureter was again noted to be on the medial leaf of the broad ligament.  The fallopian tube was elevated and electrocautery was used to cauterize and then transect just inferior to the fallopian tube, free it from the ovary. The utero-ovarian ligament was then skeletonized, cauterized and cut.  Both fallopian tube segments were handed out through the assist trocar. Monopolar electrocautery was used to excise the right ovarian cyst; this was also handed off the field through the assist trocar.  The posterior peritoneum was taken down to the level of the KOH ring.  The anterior peritoneum was also taken down.  The bladder flap was created to the level of the KOH ring.  The uterine artery on the right side was skeletonized, cauterized and cut in the normal manner.  A similar procedure was performed on the left.  The colpotomy was made and the uterus and cervix were amputated and delivered through the vagina.  Pedicles were inspected and excellent hemostasis was achieved.    The colpotomy at the vaginal cuff was closed with Vicryl on a CT1 needle in running manner.  Irrigation was used and excellent hemostasis was achieved.  At this point in the procedure was completed.  Robotic instruments were removed under direct visulaization.  The robot was undocked. The fascia at the 10-12 mm port was closed with 0 Vicryl on a UR-5 needle.  The subcuticular tissue was closed with 4-0 Vicryl and the skin was closed with 4-0 Monocryl in a subcuticular manner.  Dermabond was  applied.    The vagina was swabbed with minimal bleeding noted.  Foley catheter was removed. All sponge, lap and needle counts were correct x  3.   The patient was transferred to the recovery room in stable condition.  Eugene Garnet, MD

## 2021-04-13 NOTE — Interval H&P Note (Signed)
History and Physical Interval Note:  04/13/2021 10:35 AM  Christine Bernard  has presented today for surgery, with the diagnosis of ABNORMAL UTERINE BLEEDING,.  The various methods of treatment have been discussed with the patient and family. After consideration of risks, benefits and other options for treatment, the patient has consented to  Procedure(s): XI ROBOTIC ASSISTED TOTAL HYSTERECTOMY WITH BILATERAL SALPINGECTOMY (Bilateral) DILATATION AND CURETTAGE (N/A) POSSIBLE BILATERAL OOPHORECTOMY (Bilateral) POSSIBLE STAGING (N/A) as a surgical intervention.  The patient's history has been reviewed, patient examined, no change in status, stable for surgery.  I have reviewed the patient's chart and labs.  Questions were answered to the patient's satisfaction.     Carver Fila

## 2021-04-13 NOTE — Anesthesia Postprocedure Evaluation (Signed)
Anesthesia Post Note  Patient: Christine Bernard  Procedure(s) Performed: XI ROBOTIC ASSISTED TOTAL HYSTERECTOMY WITH BILATERAL SALPINGECTOMY (Bilateral) DILATATION AND CURETTAGE RIGHT OVARIAN CYSTECTOMY (Bilateral)     Patient location during evaluation: PACU Anesthesia Type: General Level of consciousness: awake and alert Pain management: pain level controlled Vital Signs Assessment: post-procedure vital signs reviewed and stable Respiratory status: spontaneous breathing, nonlabored ventilation and respiratory function stable Cardiovascular status: blood pressure returned to baseline and stable Postop Assessment: no apparent nausea or vomiting Anesthetic complications: no   No notable events documented.  Last Vitals:  Vitals:   04/13/21 1545 04/13/21 1548  BP:  (!) 160/85  Pulse:  92  Resp:  18  Temp:  (!) 36.4 C  SpO2: 92% 92%    Last Pain:  Vitals:   04/13/21 1530  TempSrc:   PainSc: Asleep                 Lowella Curb

## 2021-04-13 NOTE — Anesthesia Preprocedure Evaluation (Signed)
Anesthesia Evaluation  Patient identified by MRN, date of birth, ID band Patient awake    Reviewed: Allergy & Precautions, NPO status , Patient's Chart, lab work & pertinent test results  Airway Mallampati: IV  TM Distance: >3 FB Neck ROM: Full    Dental no notable dental hx.    Pulmonary sleep apnea ,    Pulmonary exam normal breath sounds clear to auscultation       Cardiovascular negative cardio ROS Normal cardiovascular exam Rhythm:Regular Rate:Normal  ECG: NSR, rate 94   Neuro/Psych CVA, No Residual Symptoms negative psych ROS   GI/Hepatic Neg liver ROS, GERD  ,IBS (irritable bowel syndrome)   Endo/Other  Morbid obesity  Renal/GU negative Renal ROS     Musculoskeletal negative musculoskeletal ROS (+)   Abdominal (+) + obese,   Peds  Hematology negative hematology ROS (+)   Anesthesia Other Findings ABNORMAL UTERINE BLEEDING  Reproductive/Obstetrics hcg negative                             Anesthesia Physical  Anesthesia Plan  ASA: 3  Anesthesia Plan: General   Post-op Pain Management:    Induction: Intravenous  PONV Risk Score and Plan: 3 and Ondansetron, Dexamethasone, Midazolam and Treatment may vary due to age or medical condition  Airway Management Planned: Oral ETT  Additional Equipment:   Intra-op Plan:   Post-operative Plan: Extubation in OR  Informed Consent: I have reviewed the patients History and Physical, chart, labs and discussed the procedure including the risks, benefits and alternatives for the proposed anesthesia with the patient or authorized representative who has indicated his/her understanding and acceptance.     Dental advisory given  Plan Discussed with: CRNA  Anesthesia Plan Comments:         Anesthesia Quick Evaluation

## 2021-04-13 NOTE — Discharge Instructions (Signed)
04/13/2021  Return to work: 4-6 weeks  Activity: 1. Be up and out of the bed during the day.  Take a nap if needed.  You may walk up steps but be careful and use the hand rail.  Stair climbing will tire you more than you think, you may need to stop part way and rest.   2. No lifting or straining for 6 weeks.  3. No driving until off narcotics and you feel that you can brake safely. For most patients, this is 1-2 weeks.  Do Not drive if you are taking narcotic pain medicine.  4. Shower daily.  Use soap and water on your incision and pat dry; don't rub.   5. No sexual activity and nothing in the vagina for 8 weeks.  Medications:  - Take ibuprofen and tylenol first line for pain control. Take these regularly (every 6 hours) to decrease the build up of pain.  - If necessary, for severe pain not relieved by ibuprofen, take tramadol.  - While taking tramadol you should take sennakot every night to reduce the likelihood of constipation. If this causes diarrhea, stop its use.  Diet: 1. Low sodium Heart Healthy Diet is recommended.  2. It is safe to use a laxative if you have difficulty moving your bowels.   Wound Care: 1. Keep clean and dry.  Shower daily.  Reasons to call the Doctor:  Fever - Oral temperature greater than 100.4 degrees Fahrenheit Foul-smelling vaginal discharge Difficulty urinating Nausea and vomiting Increased pain at the site of the incision that is unrelieved with pain medicine. Difficulty breathing with or without chest pain New calf pain especially if only on one side Sudden, continuing increased vaginal bleeding with or without clots.   Follow-up: 1. See Eugene Garnet in 3 weeks. You will have a phone visit with Dr. Pricilla Holm when pathology is back.  Contacts: For questions or concerns you should contact:  Dr. Eugene Garnet at [phone] After hours and on week-ends call [phone] and ask to speak to the physician on call for Gynecologic  Oncology

## 2021-04-14 ENCOUNTER — Telehealth: Payer: Self-pay

## 2021-04-14 ENCOUNTER — Encounter (HOSPITAL_COMMUNITY): Payer: Self-pay | Admitting: Gynecologic Oncology

## 2021-04-14 NOTE — Telephone Encounter (Signed)
Spoke with Christine Bernard this morning. She states she is eating, drinking and urinating well. She has not had a BM yet but is passing gas.  Encouraged her to drink plenty of water and to pick up her prescription for senokot. She denies fever or chills. Incisions are dry and intact. Her pain is controlled with ibuprofen, tylenol and tramadol as needed.   Instructed to call office with any fever, chills, purulent drainage, uncontrolled pain or any other questions or concerns. Patient verbalizes understanding.   Pt aware of post op appointments as well as the office number [phone] and after hours number [phone] to call if she has any questions or concerns

## 2021-04-17 DIAGNOSIS — Z1231 Encounter for screening mammogram for malignant neoplasm of breast: Secondary | ICD-10-CM

## 2021-04-17 LAB — SURGICAL PATHOLOGY

## 2021-04-21 ENCOUNTER — Encounter: Payer: Self-pay | Admitting: Gynecologic Oncology

## 2021-04-25 ENCOUNTER — Encounter: Payer: Self-pay | Admitting: Gynecologic Oncology

## 2021-04-27 ENCOUNTER — Encounter: Payer: Self-pay | Admitting: Gynecologic Oncology

## 2021-04-28 ENCOUNTER — Encounter: Payer: Self-pay | Admitting: Gynecologic Oncology

## 2021-04-28 ENCOUNTER — Other Ambulatory Visit: Payer: Self-pay

## 2021-04-28 ENCOUNTER — Inpatient Hospital Stay: Payer: 59 | Attending: Gynecologic Oncology | Admitting: Gynecologic Oncology

## 2021-04-28 VITALS — BP 144/77 | HR 93 | Temp 98.7°F | Resp 18 | Ht 64.0 in | Wt 299.8 lb

## 2021-04-28 DIAGNOSIS — Z90710 Acquired absence of both cervix and uterus: Secondary | ICD-10-CM

## 2021-04-28 DIAGNOSIS — N921 Excessive and frequent menstruation with irregular cycle: Secondary | ICD-10-CM

## 2021-04-28 DIAGNOSIS — B379 Candidiasis, unspecified: Secondary | ICD-10-CM

## 2021-04-28 DIAGNOSIS — Z9079 Acquired absence of other genital organ(s): Secondary | ICD-10-CM

## 2021-04-28 MED ORDER — FLUCONAZOLE 150 MG PO TABS: 150.0000 mg | ORAL_TABLET | Freq: Every day | ORAL | 0 refills | Status: DC

## 2021-04-28 NOTE — Patient Instructions (Signed)
It is great to see you today!  You are healing very well from surgery.  Remember no lifting more than 10-15 pounds until 6 weeks after surgery and nothing in the vagina for at least 8 weeks.  I am releasing you back to care with your gynecologist and primary care provider.  Please not hesitate to call me if you need anything.

## 2021-04-28 NOTE — Progress Notes (Signed)
Gynecologic Oncology Return Clinic Visit  04/28/2021  Reason for Visit: Postoperative visit after surgery  Treatment History: 04/13/2021: Dilation and curettage for frozen section, total robotic hysterectomy, bilateral salpingectomy, right ovarian cystectomy.  Final pathology revealed inactive endometrium, adenomyosis, no hyperplasia or malignancy.  Ovarian cyst consistent with a benign hemorrhagic cyst.  Interval History: Patient presents today for follow-up after recent surgery.  She notes overall doing well.  She has some continued fatigue but has returned to work.  She notes appetite has been up and down, denies any nausea or emesis.  She denies any fevers or chills.  She has struggled with constipation as well as gas pain.  She is using Senokot but is not having daily bowel movements.  She denies any urinary symptoms.  She has lost about 10 pounds since surgery.  She denies any vaginal bleeding or discharge.  Several days ago she noticed a "bump" on her right labia.  Past Medical/Surgical History: Past Medical History:  Diagnosis Date   Abnormal uterine bleeding (AUB)    Complication of anesthesia    Frequent urination    GERD (gastroesophageal reflux disease)    IBS (irritable bowel syndrome)    Sleep apnea    Does not use CPAP diagnosed ~ 10 years   Stroke [facility]) 2002   No residual effects thought to be secondary to Depo Provera use    Past Surgical History:  Procedure Laterality Date   CHOLECYSTECTOMY N/A 11/07/2012   Procedure: LAPAROSCOPIC CHOLECYSTECTOMY WITH INTRAOPERATIVE CHOLANGIOGRAM;  Surgeon: Wilmon Arms. Corliss Skains, MD;  Location: [facility];  Service: General;  Laterality: N/A;   DILATION AND CURETTAGE OF UTERUS N/A 04/13/2021   Procedure: DILATATION AND CURETTAGE;  Surgeon: Carver Fila, MD;  Location: [facility];  Service: Gynecology;  Laterality: N/A;   LAPAROSCOPIC TUBAL LIGATION Bilateral 06/29/2015   Procedure: LAPAROSCOPIC BILATERAL TUBAL LIGATION;  Surgeon: Myna Hidalgo,  DO;  Location: [facility];  Service: Gynecology;  Laterality: Bilateral;   LEEP  2000   OOPHORECTOMY Bilateral 04/13/2021   Procedure: RIGHT OVARIAN CYSTECTOMY;  Surgeon: Carver Fila, MD;  Location: [facility];  Service: Gynecology;  Laterality: Bilateral;   ROBOTIC ASSISTED TOTAL HYSTERECTOMY Bilateral 04/13/2021   Procedure: XI ROBOTIC ASSISTED TOTAL HYSTERECTOMY WITH BILATERAL SALPINGECTOMY;  Surgeon: Carver Fila, MD;  Location: [facility];  Service: Gynecology;  Laterality: Bilateral;   WISDOM TOOTH EXTRACTION      Family History  Problem Relation Age of Onset   Breast cancer Paternal Aunt    Colon cancer Neg Hx    Ovarian cancer Neg Hx    Endometrial cancer Neg Hx    Pancreatic cancer Neg Hx    Prostate cancer Neg Hx     Social History   Socioeconomic History   Marital status: Divorced    Spouse name: Not on file   Number of children: Not on file   Years of education: Not on file   Highest education level: Not on file  Occupational History   Not on file  Tobacco Use   Smoking status: Never   Smokeless tobacco: Never  Vaping Use   Vaping Use: Never used  Substance and Sexual Activity   Alcohol use: Never   Drug use: No   Sexual activity: Not Currently    Birth control/protection: Surgical  Other Topics Concern   Not on file  Social History Narrative   Not on file   Social Determinants of Health   Financial Resource Strain: Not on file  Food Insecurity:  Not on file  Transportation Needs: Not on file  Physical Activity: Not on file  Stress: Not on file  Social Connections: Not on file    Current Medications:  Current Outpatient Medications:    Cholecalciferol (VITAMIN D3 PO), Take 1 tablet by mouth 2 (two) times a week., Disp: , Rfl:    fluconazole (DIFLUCAN) 150 MG tablet, Take 1 tablet (150 mg total) by mouth daily., Disp: 1 tablet, Rfl: 0   ibuprofen (ADVIL) 800 MG tablet, Take 1 tablet (800 mg total) by mouth every 8 (eight) hours as needed for up to  30 doses., Disp: 30 tablet, Rfl: 0   senna (SENOKOT) 8.6 MG TABS tablet, Take 1 tablet (8.6 mg total) by mouth daily., Disp: 20 tablet, Rfl: 0   traMADol (ULTRAM) 50 MG tablet, Take 1 tablet (50 mg total) by mouth every 6 (six) hours as needed for up to 15 doses., Disp: 15 tablet, Rfl: 0   Vitamin D, Ergocalciferol, (DRISDOL) 1.25 MG (50000 UNIT) CAPS capsule, Take 50,000 Units by mouth once a week., Disp: , Rfl:   Review of Systems: Pertinent positives include fatigue, constipation, pelvic pain. Denies appetite changes, fevers, chills, unexplained weight changes. Denies hearing loss, neck lumps or masses, mouth sores, ringing in ears or voice changes. Denies cough or wheezing.  Denies shortness of breath. Denies chest pain or palpitations. Denies leg swelling. Denies abdominal distention, pain, blood in stools, diarrhea, nausea, vomiting, or early satiety. Denies pain with intercourse, dysuria, frequency, hematuria or incontinence. Denies hot flashes, vaginal bleeding or vaginal discharge.   Denies joint pain, back pain or muscle pain/cramps. Denies itching, rash, or wounds. Denies dizziness, headaches, numbness or seizures. Denies swollen lymph nodes or glands, denies easy bruising or bleeding. Denies anxiety, depression, confusion, or decreased concentration.  Physical Exam: BP (!) 144/77 (BP Location: Left Arm, Patient Position: Sitting)   Pulse 93   Temp 98.7 F (37.1 C) (Oral)   Resp 18   Ht 5\' 4"  (1.626 m)   Wt 299 lb 12.8 oz (136 kg)   SpO2 100%   BMI 51.46 kg/m  General: Alert, oriented, no acute distress. HEENT: Normocephalic, atraumatic, sclera anicteric. Chest: Unlabored breathing on room air. Abdomen: Obese, soft, nontender.  Normoactive bowel sounds.  No masses or hepatosplenomegaly appreciated.  Well-healed incisions. Extremities: Grossly normal range of motion.  Warm, well perfused.  No edema bilaterally. Skin: No rashes or lesions noted. GU: Normal appearing  external genitalia without erythema, excoriation, or lesions.  There is mild fullness on palpation of the posterior right labia majora, no distinct fluctuance or mass appreciated.  No tenderness with palpation of this area.  Speculum exam reveals no discharge or bleeding.  Cuff is intact, suture still visible.  Bimanual exam reveals cuff intact, no fluctuance or pain with palpation.    Laboratory & Radiologic Studies: None new  Assessment & Plan: Christine Bernard is a 50 y.o. woman status post robotic total hysterectomy with bilateral salpingectomy for abnormal uterine bleeding in the setting of failed previous attempt of endometrial sampling with benign final pathology.  Patient is doing well from a postoperative standpoint and meeting milestones.  We discussed continued activity restrictions as well as postoperative limitations.  We reviewed pathology from surgery.  She is very happy with this news.  While I noticed some mild edema of the right labia, I do not radiate on exam a distinct mass or something that could be drained or excised.  I think that it may be worth  treating her for a presumed yeast infection to see if her symptoms resolve.  I have asked her to keep me posted over the next few weeks.  I am releasing her back to care with her PCP and gynecologist.  32 minutes of total time was spent for this patient encounter, including preparation, face-to-face counseling with the patient and coordination of care, and documentation of the encounter.  Eugene Garnet, MD  Division of Gynecologic Oncology  Department of Obstetrics and Gynecology  [facility]

## 2021-05-22 ENCOUNTER — Encounter: Payer: Self-pay | Admitting: Gynecologic Oncology

## 2021-06-15 ENCOUNTER — Ambulatory Visit
Admission: RE | Admit: 2021-06-15 | Discharge: 2021-06-15 | Disposition: A | Discharge status: Home / Self Care | Payer: 59 | Source: Ambulatory Visit | Attending: Family Medicine | Admitting: Family Medicine

## 2021-06-15 ENCOUNTER — Other Ambulatory Visit: Payer: Self-pay

## 2021-06-15 DIAGNOSIS — Z1231 Encounter for screening mammogram for malignant neoplasm of breast: Secondary | ICD-10-CM

## 2021-06-19 ENCOUNTER — Telehealth: Payer: Self-pay

## 2021-06-19 NOTE — Telephone Encounter (Signed)
Following up with Ms. Engen re: vaginal discharge and itching. Patient reports discharge has become heavier over the past week. Patient wears depends daily and changes twice per day. She is experiencing dark brown vaginal discharge in her depends. She does not have any discharge on toilet tissue when she wipes, does not see anything in toilet and denies clots. She denies fever, chills, pain or cramping. Per Warner Mccreedy, NP patient should rinse off vaginal discharge after toileting and pat dry. The vaginal discharge may cause itching and irritation. Instructed to try topical yeast cream such as monistat to be used externally, nothing in the vagina. Encouraged her to change depends more frequently. Patient verbalized understanding. Follow up appointment scheduled with Dr. Pricilla Holm on 06/28/21 at 2:15 pm. Offered earlier appointment on 06/21/21, patient declined.  Instructed patient to call with any questions or concerns.

## 2021-06-28 ENCOUNTER — Other Ambulatory Visit: Payer: Self-pay | Admitting: Gastroenterology

## 2021-06-28 ENCOUNTER — Encounter: Payer: Self-pay | Admitting: Gynecologic Oncology

## 2021-06-28 ENCOUNTER — Other Ambulatory Visit: Payer: Self-pay

## 2021-06-28 ENCOUNTER — Inpatient Hospital Stay: Payer: 59 | Attending: Gynecologic Oncology | Admitting: Gynecologic Oncology

## 2021-06-28 VITALS — BP 153/91 | HR 89 | Temp 99.4°F | Resp 16 | Ht 64.0 in | Wt 298.0 lb

## 2021-06-28 DIAGNOSIS — Z9079 Acquired absence of other genital organ(s): Secondary | ICD-10-CM | POA: Diagnosis not present

## 2021-06-28 DIAGNOSIS — K589 Irritable bowel syndrome without diarrhea: Secondary | ICD-10-CM | POA: Insufficient documentation

## 2021-06-28 DIAGNOSIS — Z90710 Acquired absence of both cervix and uterus: Secondary | ICD-10-CM | POA: Diagnosis not present

## 2021-06-28 DIAGNOSIS — Z8673 Personal history of transient ischemic attack (TIA), and cerebral infarction without residual deficits: Secondary | ICD-10-CM | POA: Insufficient documentation

## 2021-06-28 DIAGNOSIS — N898 Other specified noninflammatory disorders of vagina: Secondary | ICD-10-CM | POA: Diagnosis not present

## 2021-06-28 DIAGNOSIS — L929 Granulomatous disorder of the skin and subcutaneous tissue, unspecified: Secondary | ICD-10-CM | POA: Diagnosis not present

## 2021-06-28 DIAGNOSIS — K219 Gastro-esophageal reflux disease without esophagitis: Secondary | ICD-10-CM | POA: Diagnosis not present

## 2021-06-28 DIAGNOSIS — G4730 Sleep apnea, unspecified: Secondary | ICD-10-CM | POA: Diagnosis not present

## 2021-06-28 NOTE — Patient Instructions (Signed)
Good to see you!  You may have a little bit of brown or gray discharge for the next couple of days.  If you were to start having discharge like you previously had been having or have bright red bleeding again, please call me to come in again.  I can use the same treatment that I did today if you start having no symptoms again.

## 2021-06-28 NOTE — Progress Notes (Signed)
Gynecologic Oncology Return Clinic Visit  04/28/2021  Reason for Visit: Vaginal discharge  Treatment History: 04/13/2021: Dilation and curettage for frozen section, total robotic hysterectomy, bilateral salpingectomy, right ovarian cystectomy.  Final pathology revealed inactive endometrium, adenomyosis, no hyperplasia or malignancy.  Ovarian cyst consistent with a benign hemorrhagic cyst.  Interval History: Patient presents today for follow-up given some vaginal symptoms that she is had since her last visit.  She notes some discharge which she describes as not daily on her depends.  She does not see any blood or discharge when she wipes but notes a brown or yellow discharge on her underwear.  She has some associated itching.  Additionally, she had intercourse for the first time after surgery on Saturday.  She had bleeding immediately after which stopped very quickly.  She denies any associated pain or cramping.  She reports normal bowel and bladder function.  Past Medical/Surgical History: Past Medical History:  Diagnosis Date   Abnormal uterine bleeding (AUB)    Complication of anesthesia    Frequent urination    GERD (gastroesophageal reflux disease)    IBS (irritable bowel syndrome)    Sleep apnea    Does not use CPAP diagnosed ~ 10 years   Stroke [facility]) 2002   No residual effects thought to be secondary to Depo Provera use    Past Surgical History:  Procedure Laterality Date   CHOLECYSTECTOMY N/A 11/07/2012   Procedure: LAPAROSCOPIC CHOLECYSTECTOMY WITH INTRAOPERATIVE CHOLANGIOGRAM;  Surgeon: Imogene Burn. Georgette Dover, MD;  Location: [place];  Service: General;  Laterality: N/A;   DILATION AND CURETTAGE OF UTERUS N/A 04/13/2021   Procedure: DILATATION AND CURETTAGE;  Surgeon: Lafonda Mosses, MD;  Location: [facility];  Service: Gynecology;  Laterality: N/A;   LAPAROSCOPIC TUBAL LIGATION Bilateral 06/29/2015   Procedure: LAPAROSCOPIC BILATERAL TUBAL LIGATION;  Surgeon: Janyth Pupa, DO;   Location: [facility];  Service: Gynecology;  Laterality: Bilateral;   LEEP  2000   OOPHORECTOMY Bilateral 04/13/2021   Procedure: RIGHT OVARIAN CYSTECTOMY;  Surgeon: Lafonda Mosses, MD;  Location: [facility];  Service: Gynecology;  Laterality: Bilateral;   ROBOTIC ASSISTED TOTAL HYSTERECTOMY Bilateral 04/13/2021   Procedure: XI ROBOTIC ASSISTED TOTAL HYSTERECTOMY WITH BILATERAL SALPINGECTOMY;  Surgeon: Lafonda Mosses, MD;  Location: [facility];  Service: Gynecology;  Laterality: Bilateral;   WISDOM TOOTH EXTRACTION      Family History  Problem Relation Age of Onset   Breast cancer Paternal Aunt    Colon cancer Neg Hx    Ovarian cancer Neg Hx    Endometrial cancer Neg Hx    Pancreatic cancer Neg Hx    Prostate cancer Neg Hx     Social History   Socioeconomic History   Marital status: Divorced    Spouse name: Not on file   Number of children: Not on file   Years of education: Not on file   Highest education level: Not on file  Occupational History   Not on file  Tobacco Use   Smoking status: Never   Smokeless tobacco: Never  Vaping Use   Vaping Use: Never used  Substance and Sexual Activity   Alcohol use: Never   Drug use: No   Sexual activity: Not Currently    Birth control/protection: Surgical  Other Topics Concern   Not on file  Social History Narrative   Not on file   Social Determinants of Health   Financial Resource Strain: Not on file  Food Insecurity: Not on file  Transportation Needs: Not on  file  Physical Activity: Not on file  Stress: Not on file  Social Connections: Not on file    Current Medications:  Current Outpatient Medications:    Vitamin D, Ergocalciferol, (DRISDOL) 1.25 MG (50000 UNIT) CAPS capsule, Take 50,000 Units by mouth once a week., Disp: , Rfl:    Cholecalciferol (VITAMIN D3 PO), Take 1 tablet by mouth 2 (two) times a week. (Patient not taking: Reported on 06/28/2021), Disp: , Rfl:    fluconazole (DIFLUCAN) 150 MG tablet, Take 1 tablet  (150 mg total) by mouth daily. (Patient not taking: Reported on 06/28/2021), Disp: 1 tablet, Rfl: 0   ibuprofen (ADVIL) 800 MG tablet, Take 1 tablet (800 mg total) by mouth every 8 (eight) hours as needed for up to 30 doses. (Patient not taking: Reported on 06/28/2021), Disp: 30 tablet, Rfl: 0   senna (SENOKOT) 8.6 MG TABS tablet, Take 1 tablet (8.6 mg total) by mouth daily. (Patient not taking: Reported on 06/28/2021), Disp: 20 tablet, Rfl: 0   traMADol (ULTRAM) 50 MG tablet, Take 1 tablet (50 mg total) by mouth every 6 (six) hours as needed for up to 15 doses. (Patient not taking: Reported on 06/28/2021), Disp: 15 tablet, Rfl: 0  Review of Systems: Denies appetite changes, fevers, chills, fatigue, unexplained weight changes. Denies hearing loss, neck lumps or masses, mouth sores, ringing in ears or voice changes. Denies cough or wheezing.  Denies shortness of breath. Denies chest pain or palpitations. Denies leg swelling. Denies abdominal distention, pain, blood in stools, constipation, diarrhea, nausea, vomiting, or early satiety. Denies pain with intercourse, dysuria, frequency, hematuria or incontinence. Denies hot flashes, pelvic pain.   Denies joint pain, back pain or muscle pain/cramps. Denies itching, rash, or wounds. Denies dizziness, headaches, numbness or seizures. Denies swollen lymph nodes or glands, denies easy bruising or bleeding. Denies anxiety, depression, confusion, or decreased concentration.  Physical Exam: BP (!) 153/91 (BP Location: Right Arm, Patient Position: Sitting)   Pulse 89   Temp 99.4 F (37.4 C) (Tympanic)   Resp 16   Ht 5\' 4"  (1.626 m)   Wt 298 lb (135.2 kg)   SpO2 96%   BMI 51.15 kg/m  General: Alert, oriented, no acute distress. HEENT: Normocephalic, atraumatic, sclera anicteric. Chest: Unlabored breathing on room air. Extremities: Grossly normal range of motion.  Warm, well perfused.  No edema bilaterally. Skin: No rashes or lesions noted. GU:  Normal appearing external genitalia without erythema, excoriation, or lesions.  Speculum exam reveals minimal discharge, 1-2 cm area of granulation tissue along the cuff, bleeds some with manipulation of the speculum.  This area was treated with silver nitrate with no bleeding noted after.  Bimanual exam reveals palpable area of granulation tissue, cuffed otherwise intact.  Laboratory & Radiologic Studies: None new  Assessment & Plan: Christine Bernard is a 50 y.o. woman who is more than 2 months out from robotic hysterectomy with benign final pathology presenting with vaginal discharge and recent episode of vaginal bleeding found to have granulation tissue on exam.  Discussed exam findings with the patient.  Area treated with silver nitrate with good effect noted.  Asked her to refrain from heavy lifting or sexual activity for a couple of weeks.  If she were to have recurrence of spotting or bleeding, I have asked her to call to let me know so that she can come back in for second treatment with silver nitrate.  28 minutes of total time was spent for this patient encounter, including preparation, face-to-face  counseling with the patient and coordination of care, and documentation of the encounter.  Jeral Pinch, MD  Division of Gynecologic Oncology  Department of Obstetrics and Gynecology  [facility]

## 2021-07-24 ENCOUNTER — Ambulatory Visit: Payer: 59 | Admitting: Gynecologic Oncology

## 2021-07-24 ENCOUNTER — Telehealth: Payer: Self-pay

## 2021-07-24 NOTE — Telephone Encounter (Signed)
Called pt to schedule an appt to see Dr. Pricilla Holm this week. No answer. Left VM.

## 2021-08-01 NOTE — Progress Notes (Signed)
Gynecologic Oncology Return Clinic Visit  08/02/21  Reason for Visit: Vaginal discharge  Treatment History: 04/13/2021: Dilation and curettage for frozen section, total robotic hysterectomy, bilateral salpingectomy, right ovarian cystectomy.  Final pathology revealed inactive endometrium, adenomyosis, no hyperplasia or malignancy.  Ovarian cyst consistent with a benign hemorrhagic cyst.  Interval History: Presents for follow-up today.  Has continued to have some vaginal discharge although less than previously.  She describes that this is not daily.  Sometimes it will be lighter and sometimes start vaginal discharge.  Other days she has none at all.  She has had 1 day with some mild pelvic cramping, otherwise denies any abdominal pain or cramping.   She reports normal bowel and bladder function.  Past Medical/Surgical History: Past Medical History:  Diagnosis Date   Abnormal uterine bleeding (AUB)    Complication of anesthesia    Frequent urination    GERD (gastroesophageal reflux disease)    IBS (irritable bowel syndrome)    Sleep apnea    Does not use CPAP diagnosed ~ 10 years   Stroke [facility]) 2002   No residual effects thought to be secondary to Depo Provera use    Past Surgical History:  Procedure Laterality Date   CHOLECYSTECTOMY N/A 11/07/2012   Procedure: LAPAROSCOPIC CHOLECYSTECTOMY WITH INTRAOPERATIVE CHOLANGIOGRAM;  Surgeon: Imogene Burn. Georgette Dover, MD;  Location: [place];  Service: General;  Laterality: N/A;   DILATION AND CURETTAGE OF UTERUS N/A 04/13/2021   Procedure: DILATATION AND CURETTAGE;  Surgeon: Lafonda Mosses, MD;  Location: [facility];  Service: Gynecology;  Laterality: N/A;   LAPAROSCOPIC TUBAL LIGATION Bilateral 06/29/2015   Procedure: LAPAROSCOPIC BILATERAL TUBAL LIGATION;  Surgeon: Janyth Pupa, DO;  Location: [place] [facility];  Service: Gynecology;  Laterality: Bilateral;   LEEP  2000   OOPHORECTOMY Bilateral 04/13/2021   Procedure: RIGHT OVARIAN CYSTECTOMY;  Surgeon: Lafonda Mosses, MD;  Location: [facility];  Service: Gynecology;  Laterality: Bilateral;   ROBOTIC ASSISTED TOTAL HYSTERECTOMY Bilateral 04/13/2021   Procedure: XI ROBOTIC ASSISTED TOTAL HYSTERECTOMY WITH BILATERAL SALPINGECTOMY;  Surgeon: Lafonda Mosses, MD;  Location: [facility];  Service: Gynecology;  Laterality: Bilateral;   WISDOM TOOTH EXTRACTION      Family History  Problem Relation Age of Onset   Breast cancer Paternal Aunt    Colon cancer Neg Hx    Ovarian cancer Neg Hx    Endometrial cancer Neg Hx    Pancreatic cancer Neg Hx    Prostate cancer Neg Hx     Social History   Socioeconomic History   Marital status: Divorced    Spouse name: Not on file   Number of children: Not on file   Years of education: Not on file   Highest education level: Not on file  Occupational History   Not on file  Tobacco Use   Smoking status: Never   Smokeless tobacco: Never  Vaping Use   Vaping Use: Never used  Substance and Sexual Activity   Alcohol use: Never   Drug use: No   Sexual activity: Not Currently    Birth control/protection: Surgical  Other Topics Concern   Not on file  Social History Narrative   Not on file   Social Determinants of Health   Financial Resource Strain: Not on file  Food Insecurity: Not on file  Transportation Needs: Not on file  Physical Activity: Not on file  Stress: Not on file  Social Connections: Not on file    Current Medications:  Current Outpatient Medications:  fluconazole (DIFLUCAN) 150 MG tablet, Take 1 tablet (150 mg total) by mouth daily., Disp: 1 tablet, Rfl: 0   metroNIDAZOLE (FLAGYL) 500 MG tablet, Take 1 tablet (500 mg total) by mouth 2 (two) times daily for 7 days., Disp: 14 tablet, Rfl: 0   Vitamin D, Ergocalciferol, (DRISDOL) 1.25 MG (50000 UNIT) CAPS capsule, Take 50,000 Units by mouth once a week., Disp: , Rfl:    Cholecalciferol (VITAMIN D3 PO), Take 1 tablet by mouth 2 (two) times a week. (Patient not taking: Reported on  06/28/2021), Disp: , Rfl:    fluconazole (DIFLUCAN) 150 MG tablet, Take 1 tablet (150 mg total) by mouth daily. (Patient not taking: Reported on 06/28/2021), Disp: 1 tablet, Rfl: 0   ibuprofen (ADVIL) 800 MG tablet, Take 1 tablet (800 mg total) by mouth every 8 (eight) hours as needed for up to 30 doses. (Patient not taking: Reported on 06/28/2021), Disp: 30 tablet, Rfl: 0   senna (SENOKOT) 8.6 MG TABS tablet, Take 1 tablet (8.6 mg total) by mouth daily. (Patient not taking: Reported on 06/28/2021), Disp: 20 tablet, Rfl: 0   traMADol (ULTRAM) 50 MG tablet, Take 1 tablet (50 mg total) by mouth every 6 (six) hours as needed for up to 15 doses. (Patient not taking: Reported on 06/28/2021), Disp: 15 tablet, Rfl: 0  Review of Systems: Pertinent positives as per HPI. Denies appetite changes, fevers, chills, fatigue, unexplained weight changes. Denies hearing loss, neck lumps or masses, mouth sores, ringing in ears or voice changes. Denies cough or wheezing.  Denies shortness of breath. Denies chest pain or palpitations. Denies leg swelling. Denies abdominal distention, pain, blood in stools, constipation, diarrhea, nausea, vomiting, or early satiety. Denies pain with intercourse, dysuria, frequency, hematuria or incontinence. Denies hot flashes, vaginal bleeding.   Denies joint pain, back pain or muscle pain/cramps. Denies itching, rash, or wounds. Denies dizziness, headaches, numbness or seizures. Denies swollen lymph nodes or glands, denies easy bruising or bleeding. Denies anxiety, depression, confusion, or decreased concentration.  Physical Exam: BP (!) 147/72 (BP Location: Right Arm, Patient Position: Sitting)    Pulse 90    Temp 98.2 F (36.8 C) (Tympanic)    Resp 18    Ht 5\' 4"  (1.626 m)    Wt (!) 301 lb (136.5 kg)    SpO2 99%    BMI 51.67 kg/m  General: Alert, oriented, no acute distress. HEENT: Posterior oropharynx clear, sclera anicteric. Chest: Unlabored breathing on room  air. Abdomen: Obese, soft, nontender.  Normoactive bowel sounds.  No masses or hepatosplenomegaly appreciated.  Well-healed incisions. Extremities: Grossly normal range of motion.  Warm, well perfused.  No edema bilaterally. Skin: No rashes or lesions noted. GU: Normal appearing external genitalia without erythema, excoriation, or lesions.  Speculum exam reveals well rugated vaginal mucosa, physiologic discharge.  Large speculum used and difficulty appreciating the cuff today despite manipulation of the speculum.  I do not see any blood or granulation tissue.  Bimanual exam reveals cuff intact with a 1-2 cm area of what feels like scar versus granulation tissue along the suture line, nontender.    Laboratory & Radiologic Studies: None new  Assessment & Plan: Christine Bernard is a 50 y.o. woman who is robotic hysterectomy on 04/13/21 with benign final pathology presenting noted to have granulation tissue on her last exam treated with silver nitrate, now without further bleeding but with continued vaginal discharge.  I suspect that the patient has persistent granulation tissue at the cuff.  I am unable  to see this today, I do not see any evidence that it is bleeding.  Given persistent discharge, I am suggesting that we treat for bacterial vaginosis with a week course of Flagyl.  I will send in a prescription for Diflucan as she often gets yeast infections after taking antibiotics.  We will tentatively have a phone visit in 2 months to check in.  If she continues to have persistent discharge, we discussed today possibility of an exam under anesthesia and outpatient procedure to visualize the cuff and cauterize any scar tissue at the apex if it is seen and appears to be the source of her bleeding/discharge.   22 minutes of total time was spent for this patient encounter, including preparation, face-to-face counseling with the patient and coordination of care, and documentation of the encounter.  Eugene Garnet, MD  Division of Gynecologic Oncology  Department of Obstetrics and Gynecology  [facility]

## 2021-08-02 ENCOUNTER — Other Ambulatory Visit: Payer: Self-pay

## 2021-08-02 ENCOUNTER — Encounter: Payer: Self-pay | Admitting: Gynecologic Oncology

## 2021-08-02 ENCOUNTER — Inpatient Hospital Stay: Payer: 59 | Attending: Gynecologic Oncology | Admitting: Gynecologic Oncology

## 2021-08-02 VITALS — BP 147/72 | HR 90 | Temp 98.2°F | Resp 18 | Ht 64.0 in | Wt 301.0 lb

## 2021-08-02 DIAGNOSIS — K219 Gastro-esophageal reflux disease without esophagitis: Secondary | ICD-10-CM | POA: Diagnosis not present

## 2021-08-02 DIAGNOSIS — Z90722 Acquired absence of ovaries, bilateral: Secondary | ICD-10-CM | POA: Diagnosis not present

## 2021-08-02 DIAGNOSIS — Z8673 Personal history of transient ischemic attack (TIA), and cerebral infarction without residual deficits: Secondary | ICD-10-CM | POA: Insufficient documentation

## 2021-08-02 DIAGNOSIS — K589 Irritable bowel syndrome without diarrhea: Secondary | ICD-10-CM | POA: Diagnosis not present

## 2021-08-02 DIAGNOSIS — G4730 Sleep apnea, unspecified: Secondary | ICD-10-CM | POA: Diagnosis not present

## 2021-08-02 DIAGNOSIS — Z90710 Acquired absence of both cervix and uterus: Secondary | ICD-10-CM | POA: Diagnosis not present

## 2021-08-02 DIAGNOSIS — N898 Other specified noninflammatory disorders of vagina: Secondary | ICD-10-CM

## 2021-08-02 MED ORDER — METRONIDAZOLE 500 MG PO TABS: 500.0000 mg | ORAL_TABLET | Freq: Two times a day (BID) | ORAL | 0 refills | Status: AC

## 2021-08-02 MED ORDER — FLUCONAZOLE 150 MG PO TABS: 150.0000 mg | ORAL_TABLET | Freq: Every day | ORAL | 0 refills | Status: DC

## 2021-08-02 NOTE — Patient Instructions (Signed)
It was good to see you today.  I cannot see anything on your exam but I feel little area where I think there is some scar tissue at the top of the vagina.  I did not see any bleeding and only normal discharge.  Given the quantity of discharge you are having, I will send in a prescription for Flagyl to see if we can reset your vaginal flora.  I will also send a prescription for a one-time dose of Diflucan to treat a yeast infection if you develop one.

## 2021-08-11 ENCOUNTER — Ambulatory Visit: Payer: 59 | Admitting: Gynecologic Oncology

## 2021-08-11 ENCOUNTER — Encounter (HOSPITAL_COMMUNITY): Payer: Self-pay | Admitting: Gastroenterology

## 2021-08-16 ENCOUNTER — Encounter (HOSPITAL_COMMUNITY): Payer: Self-pay | Admitting: Anesthesiology

## 2021-08-22 ENCOUNTER — Encounter (HOSPITAL_COMMUNITY): Admission: RE | Payer: Self-pay | Source: Home / Self Care

## 2021-08-22 ENCOUNTER — Ambulatory Visit (HOSPITAL_COMMUNITY): Admission: RE | Admit: 2021-08-22 | Payer: 59 | Source: Home / Self Care | Admitting: Gastroenterology

## 2021-08-22 SURGERY — COLONOSCOPY WITH PROPOFOL
Anesthesia: Monitor Anesthesia Care

## 2021-10-13 ENCOUNTER — Telehealth: Payer: Self-pay | Admitting: *Deleted

## 2021-10-13 NOTE — Telephone Encounter (Signed)
Per Dr Pricilla Holm attempted to reach the patient regarding her upcoming canceled appt; [facility] to call the office back  ?

## 2021-10-17 ENCOUNTER — Telehealth: Payer: 59 | Admitting: Gynecologic Oncology

## 2022-08-14 DIAGNOSIS — R7982 Elevated C-reactive protein (CRP): Secondary | ICD-10-CM | POA: Diagnosis not present

## 2022-08-23 ENCOUNTER — Other Ambulatory Visit: Payer: Self-pay | Admitting: Family Medicine

## 2022-08-23 DIAGNOSIS — Z1231 Encounter for screening mammogram for malignant neoplasm of breast: Secondary | ICD-10-CM

## 2022-09-26 ENCOUNTER — Ambulatory Visit
Admission: RE | Admit: 2022-09-26 | Discharge: 2022-09-26 | Disposition: A | Discharge status: Home / Self Care | Payer: Medicaid Other | Source: Ambulatory Visit | Attending: Physical Medicine and Rehabilitation | Admitting: Physical Medicine and Rehabilitation

## 2022-09-26 ENCOUNTER — Other Ambulatory Visit: Payer: Self-pay | Admitting: Physical Medicine and Rehabilitation

## 2022-09-26 DIAGNOSIS — M545 Low back pain, unspecified: Secondary | ICD-10-CM

## 2022-10-12 ENCOUNTER — Other Ambulatory Visit: Payer: Self-pay | Admitting: Gastroenterology

## 2022-10-15 ENCOUNTER — Encounter (HOSPITAL_COMMUNITY): Payer: Self-pay | Admitting: Gastroenterology

## 2022-10-22 ENCOUNTER — Ambulatory Visit (HOSPITAL_COMMUNITY): Payer: Medicaid Other | Admitting: Certified Registered"

## 2022-10-22 ENCOUNTER — Encounter (HOSPITAL_COMMUNITY): Payer: Self-pay | Admitting: Gastroenterology

## 2022-10-22 ENCOUNTER — Other Ambulatory Visit: Payer: Self-pay

## 2022-10-22 ENCOUNTER — Ambulatory Visit (HOSPITAL_BASED_OUTPATIENT_CLINIC_OR_DEPARTMENT_OTHER): Payer: Medicaid Other | Admitting: Certified Registered"

## 2022-10-22 ENCOUNTER — Ambulatory Visit (HOSPITAL_COMMUNITY)
Admission: RE | Admit: 2022-10-22 | Discharge: 2022-10-22 | Disposition: A | Discharge status: Home / Self Care | Payer: Medicaid Other | Source: Ambulatory Visit | Attending: Gastroenterology | Admitting: Gastroenterology

## 2022-10-22 ENCOUNTER — Encounter (HOSPITAL_COMMUNITY)
Admission: RE | Disposition: A | Discharge status: Home / Self Care | Payer: Self-pay | Source: Ambulatory Visit | Attending: Gastroenterology

## 2022-10-22 DIAGNOSIS — Z6841 Body Mass Index (BMI) 40.0 and over, adult: Secondary | ICD-10-CM | POA: Insufficient documentation

## 2022-10-22 DIAGNOSIS — Z1211 Encounter for screening for malignant neoplasm of colon: Secondary | ICD-10-CM | POA: Insufficient documentation

## 2022-10-22 DIAGNOSIS — K219 Gastro-esophageal reflux disease without esophagitis: Secondary | ICD-10-CM | POA: Insufficient documentation

## 2022-10-22 DIAGNOSIS — K648 Other hemorrhoids: Secondary | ICD-10-CM | POA: Diagnosis not present

## 2022-10-22 DIAGNOSIS — G4730 Sleep apnea, unspecified: Secondary | ICD-10-CM | POA: Insufficient documentation

## 2022-10-22 DIAGNOSIS — K5730 Diverticulosis of large intestine without perforation or abscess without bleeding: Secondary | ICD-10-CM | POA: Diagnosis not present

## 2022-10-22 DIAGNOSIS — Z8673 Personal history of transient ischemic attack (TIA), and cerebral infarction without residual deficits: Secondary | ICD-10-CM | POA: Diagnosis not present

## 2022-10-22 DIAGNOSIS — K635 Polyp of colon: Secondary | ICD-10-CM | POA: Diagnosis not present

## 2022-10-22 DIAGNOSIS — I10 Essential (primary) hypertension: Secondary | ICD-10-CM | POA: Insufficient documentation

## 2022-10-22 DIAGNOSIS — Z79899 Other long term (current) drug therapy: Secondary | ICD-10-CM | POA: Insufficient documentation

## 2022-10-22 DIAGNOSIS — K589 Irritable bowel syndrome without diarrhea: Secondary | ICD-10-CM | POA: Insufficient documentation

## 2022-10-22 HISTORY — PX: BIOPSY: SHX5522

## 2022-10-22 HISTORY — PX: POLYPECTOMY: SHX5525

## 2022-10-22 HISTORY — PX: COLONOSCOPY WITH PROPOFOL: SHX5780

## 2022-10-22 SURGERY — COLONOSCOPY WITH PROPOFOL
Anesthesia: Monitor Anesthesia Care | Laterality: Bilateral

## 2022-10-22 MED ORDER — PROPOFOL 10 MG/ML IV BOLUS
INTRAVENOUS | Status: DC | PRN
  Administered 2022-10-22 (×2): 20 mg via INTRAVENOUS

## 2022-10-22 MED ORDER — LACTATED RINGERS IV SOLN
INTRAVENOUS | Status: AC | PRN
  Administered 2022-10-22: 1000 mL via INTRAVENOUS

## 2022-10-22 MED ORDER — PROPOFOL 10 MG/ML IV BOLUS
INTRAVENOUS | Status: AC
  Filled 2022-10-22: qty 20

## 2022-10-22 MED ORDER — PROPOFOL 1000 MG/100ML IV EMUL
INTRAVENOUS | Status: AC
  Filled 2022-10-22: qty 100

## 2022-10-22 MED ORDER — LACTATED RINGERS IV SOLN: INTRAVENOUS | Status: DC | PRN

## 2022-10-22 MED ORDER — SODIUM CHLORIDE 0.9 % IV SOLN: INTRAVENOUS | Status: DC

## 2022-10-22 MED ORDER — PROPOFOL 500 MG/50ML IV EMUL
INTRAVENOUS | Status: DC | PRN
  Administered 2022-10-22: 130 ug/kg/min via INTRAVENOUS

## 2022-10-22 MED ORDER — LIDOCAINE 2% (20 MG/ML) 5 ML SYRINGE
INTRAMUSCULAR | Status: DC | PRN
  Administered 2022-10-22: 100 mg via INTRAVENOUS

## 2022-10-22 SURGICAL SUPPLY — 22 items

## 2022-10-22 NOTE — Op Note (Signed)
[facility] Patient Name: Christine Bernard Procedure Date: 10/22/2022 MRN: [id number] Attending MD: Ronnette Juniper , MD, [id number] Date of Birth: [birth year] CSN: [id number] Age: 52 Admit Type: Outpatient Procedure:                Colonoscopy Indications:              Screening for colorectal malignant neoplasm, This                            is the patient's first colonoscopy, Intermittent                            dIncidental - Diarrhea Providers:                Ronnette Juniper, MD, Doristine Johns, RN, William Dalton, Technician Referring MD:             Dwyane Luo Ross,MD Medicines:                Monitored Anesthesia Care Complications:            No immediate complications. Estimated blood loss:                            Minimal. Estimated Blood Loss:     Estimated blood loss was minimal. Procedure:                Pre-Anesthesia Assessment:                           - Prior to the procedure, a History and Physical                            was performed, and patient medications and                            allergies were reviewed. The patient's tolerance of                            previous anesthesia was also reviewed. The risks                            and benefits of the procedure and the sedation                            options and risks were discussed with the patient.                            All questions were answered, and informed consent                            was obtained. Prior Anticoagulants: The patient has                            taken no anticoagulant or  antiplatelet agents. ASA                            Grade Assessment: IV - A patient with severe                            systemic disease that is a constant threat to life.                            After reviewing the risks and benefits, the patient                            was deemed in satisfactory condition to undergo the                             procedure.                           After obtaining informed consent, the colonoscope                            was passed under direct vision. Throughout the                            procedure, the patient's blood pressure, pulse, and                            oxygen saturations were monitored continuously. The                            [id number]) Olympus colonoscope was                            introduced through the anus and advanced to the the                            terminal ileum. The colonoscopy was performed                            without difficulty. The patient tolerated the                            procedure well. The quality of the bowel                            preparation was good. The terminal ileum, ileocecal                            valve, appendiceal orifice, and rectum were                            photographed. Scope In: 7:43:39 AM Scope Out: 7:58:51 AM Scope Withdrawal Time: 0 hours 10 minutes 26 seconds  Total Procedure Duration: 0 hours 15 minutes 12 seconds  Findings:  The perianal and digital rectal examinations were normal.      The terminal ileum appeared normal.      A 4 mm polyp was found in the sigmoid colon. The polyp was sessile. The       polyp was removed with a piecemeal technique using a cold biopsy       forceps. Resection and retrieval were complete.      Biopsies for histology were taken with a cold forceps for evaluation of       microscopic colitis.      A few small-mouthed diverticula were found in the sigmoid colon and       descending colon.      Non-bleeding internal hemorrhoids were found during retroflexion. The       hemorrhoids were medium-sized. Impression:               - The examined portion of the ileum was normal.                           - One 4 mm polyp in the sigmoid colon, removed                            piecemeal using a cold biopsy forceps. Resected and                             retrieved.                           - Diverticulosis in the sigmoid colon and in the                            descending colon.                           - Non-bleeding internal hemorrhoids.                           - Biopsies were taken with a cold forceps for                            evaluation of microscopic colitis. Moderate Sedation:      Patient did not receive moderate sedation for this procedure, but       instead received monitored anesthesia care. Recommendation:           - Patient has a contact number available for                            emergencies. The signs and symptoms of potential                            delayed complications were discussed with the                            patient. Return to normal activities tomorrow.                            Written discharge instructions were  provided to the                            patient.                           - Resume regular diet.                           - Continue present medications.                           - Await pathology results.                           - Repeat colonoscopy for surveillance based on                            pathology results. Procedure Code(s):        --- Professional ---                           [phone], Colonoscopy, flexible; with biopsy, single                            or multiple Diagnosis Code(s):        --- Professional ---                           Z12.11, Encounter for screening for malignant                            neoplasm of colon                           K64.8, Other hemorrhoids                           D12.5, Benign neoplasm of sigmoid colon                           K57.30, Diverticulosis of large intestine without                            perforation or abscess without bleeding CPT copyright 2022 American Medical Association. All rights reserved. The codes documented in this report are preliminary and upon coder review may  be revised to meet current  compliance requirements. Ronnette Juniper, MD 10/22/2022 8:07:03 AM This report has been signed electronically. Number of Addenda: 0

## 2022-10-22 NOTE — Anesthesia Procedure Notes (Signed)
Procedure Name: MAC Date/Time: 10/22/2022 7:40 AM  Performed by: Eben Burow, CRNAPre-anesthesia Checklist: Patient identified, Emergency Drugs available, Suction available, Patient being monitored and Timeout performed Oxygen Delivery Method: Simple face mask Placement Confirmation: positive ETCO2

## 2022-10-22 NOTE — H&P (Signed)
History of Present Illness General:       51/female referred for colonoscopy and abdominal pain and urgency.Patient did not complete stool test to check H. 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   Current MedicationsTakingCelecoxib 200 MG Capsule 1 capsule with food Orally Twice a day , Notes to Pharmacist: stop meloxicamhydroCHLOROthiazide 25 MG Tablet 1 tablet in the morning Orally Once a day , Notes to Pharmacist: RossLosartan Potassium 50 MG Tablet 1 tablet Orally Once a day , Notes to Pharmacist: RossMethocarbamol 750 MG Tablet 1 tablet Orally every 4 hrs for tight muscles, Notes to Pharmacist: stop meloxicamOzempic (0.25 or 0.5 MG/DOSE)(Semaglutide(0.25 or 0.5MG /DOS)) 2 MG/3ML Solution Pen-injector 0.5 mg Subcutaneous Once a week , Notes to Pharmacist: Rosemary Holms 09/20/2022 07:54:06 AM >RossErgocalciferol 1.25 MG (50000 UT) Capsule 1 capsule Orally once a week , Notes to Pharmacist: rossMedication List reviewed and reconciled with the patientPast Medical History     CIN3 2000.     stroke (thought to be secondary to Depo Provera use) in 2008.     low vitamin D.     Dr Vilinda Boehringer- GYN.     Hysterectomy 04/2021.     Morbid Obesity.     Sleep apnea.Surgical History       gallbladder 11/2012      left breast bx- cyst 1.21.16      BTL 06/2015      hysterectomy 9/2022Family HistoryFather: alive 61 yrsMother: alive 74 yrs, heart murmurPaternal Grand Father: deceasedPaternal Grand Mother: deceased, Colon cancerMaternal [place] Father: deceasedMaternal Grand Mother: deceased, DementiaBrother 1: alive 44 yrsBrother2: alive 62 yrsPaternal aunt: Breast cancer diagnosed before 71??, diagnosed with Breast cancerOn father's side, DM/HTN. No MIs, stroke.No family history of pituitary disorders or endocrine neoplasia.No family hx of polpys, or liver dx.Social HistoryGeneral: Tobacco use     cigarettes:  Never smoked    Tobacco history last updated  10/12/2022    Vaping  NoEXPOSURE TO PASSIVE SMOKE: no.Alcohol: no.Caffeine: yes, coffee 1 cup once or twice a week.Recreational drug use: no.DIET: good, keto occasionally.Exercise: no.DENTAL CARE: good.Marital Status: divorced.Children: 1 adopted child.OCCUPATION: employed, self - employed Psychologist, prison and probation services).AllergiesN.K.D.A.Hospitalization/Major Diagnostic Procedurestroke 2000None in the past year 03/2024Review of SystemsGI PROCEDURE:        Pacemaker/ AICD no.  Artificial heart valves no.  MI/heart attack no.  Abnormal heart rhythm no.  Angina no.  CVA YES.  Hypertension YES.  Hypotension no.  Asthma, COPD no.  Sleep apnea YES.  Seizure disorders no.  Artificial joints no.  Severe DJD no.  Diabetes no.  Significant headaches no.  Vertigo no.  Depression/anxiety no.  Abnormal bleeding no.  Kidney Disease no.  Liver disease no.  Chance of pregnancy no.  Blood transfusion no.  Vital SignsWt: 330.6, Wt change: -6.4 lbs, Ht: 64, BMI: 56.74, Temp: 98.2, Pulse sitting: 93, BP sitting:  136/99.1st BP 160/94, P 90.ExaminationGastroenterology::         GENERAL APPEARANCE: Well developed, morbidly obese, no active distress, pleasant.        SCLERA: anicteric.         CARDIOVASCULAR Normal RRR .         RESPIRATORY Breath sounds normal. Respiration even and unlabored.          ABDOMEN No masses palpated. Liver and spleen not palpated, normal. Bowel sounds normal, Abdomen not distended.         EXTREMITIES: No edema.         NEURO: alert, oriented to time, place and person, normal gait.         PSYCH: mood/affect normal.   Assessments 1. Morbidly obese - E66.01 (Primary) 2. Screen for colon cancer - Z12.11 3. Intermittent diarrhea - R19.7  Treatment 1. Morbidly obese Notes: BMI is more than 50, needs to be schedule at the hospital.   2. Screen for colon cancer       IMAGING: Colonoscopy (Ordered for 10/12/2022)Petrocelli, Tiffany 10/12/2022 02:14:12 PM > pt. scheduled at [facility] on 03/18. instructions and rx given to patient.Notes: The risks and the benefits of the procedure were discussed with the patient in details. She understands and verbalizes consent.She will be given written instructions,prescription for preparation and will be scheduled for the same.   3. Intermittent diarrhea Notes: Plan to take random colon biopsies to evaluate for microscopic colitis.

## 2022-10-22 NOTE — Discharge Instructions (Signed)
YOU HAD AN ENDOSCOPIC PROCEDURE TODAY: Refer to the procedure report and other information in the discharge instructions given to you for any specific questions about what was found during the examination. If this information does not answer your questions, please call [place] office at [phone] to clarify.   YOU SHOULD EXPECT: Some feelings of bloating in the abdomen. Passage of more gas than usual. Walking can help get rid of the air that was put into your GI tract during the procedure and reduce the bloating. If you had a lower endoscopy (such as a colonoscopy or flexible sigmoidoscopy) you may notice spotting of blood in your stool or on the toilet paper. Some abdominal soreness may be present for a day or two, also.  DIET: Your first meal following the procedure should be a light meal and then it is ok to progress to your normal diet. A half-sandwich or bowl of soup is an example of a good first meal. Heavy or fried foods are harder to digest and may make you feel nauseous or bloated. Drink plenty of fluids but you should avoid alcoholic beverages for 24 hours. I   ACTIVITY: Your care partner should take you home directly after the procedure. You should plan to take it easy, moving slowly for the rest of the day. You can resume normal activity the day after the procedure however YOU SHOULD NOT DRIVE, use power tools, machinery or perform tasks that involve climbing or major physical exertion for 24 hours (because of the sedation medicines used during the test).   SYMPTOMS TO REPORT IMMEDIATELY: A gastroenterologist can be reached at any hour. Please call [phone]  for any of the following symptoms:  Following lower endoscopy (colonoscopy, flexible sigmoidoscopy) Excessive amounts of blood in the stool  Significant tenderness, worsening of abdominal pains  Swelling of the abdomen that is new, acute  Fever of 100 or higher    FOLLOW UP:  If any biopsies were taken you will be contacted  by phone or by letter within the next 1-3 weeks. Call [phone]  if you have not heard about the biopsies in 3 weeks.  Please also call with any specific questions about appointments or follow up tests.

## 2022-10-22 NOTE — Transfer of Care (Signed)
Immediate Anesthesia Transfer of Care Note  Patient: Christine Bernard  Procedure(s) Performed: COLONOSCOPY WITH PROPOFOL (Bilateral) BIOPSY POLYPECTOMY  Patient Location: PACU and Endoscopy Unit  Anesthesia Type:MAC  Level of Consciousness: awake, drowsy, and patient cooperative  Airway & Oxygen Therapy: Patient Spontanous Breathing and Patient connected to face mask oxygen  Post-op Assessment: Report given to RN and Post -op Vital signs reviewed and stable  Post vital signs: Reviewed and stable  Last Vitals:  Vitals Value Taken Time  BP 160/76   Temp    Pulse 98 10/22/22 0808  Resp 19 10/22/22 0808  SpO2 100 % 10/22/22 0808  Vitals shown include unvalidated device data.  Last Pain:  Vitals:   10/22/22 0645  TempSrc: Tympanic  PainSc: 0-No pain         Complications: No notable events documented.

## 2022-10-22 NOTE — Anesthesia Preprocedure Evaluation (Addendum)
Anesthesia Evaluation  Patient identified by MRN, date of birth, ID band Patient awake    Reviewed: Allergy & Precautions, NPO status , Patient's Chart, lab work & pertinent test results  Airway Mallampati: II  TM Distance: >3 FB Neck ROM: Full    Dental  (+) Teeth Intact, Dental Advisory Given   Pulmonary sleep apnea    Pulmonary exam normal breath sounds clear to auscultation       Cardiovascular hypertension, Pt. on medications Normal cardiovascular exam Rhythm:Regular Rate:Normal     Neuro/Psych CVA    GI/Hepatic Neg liver ROS,GERD  ,,IBS   Endo/Other    Morbid obesity (BMI 56)  Renal/GU negative Renal ROS     Musculoskeletal negative musculoskeletal ROS (+)    Abdominal   Peds  Hematology negative hematology ROS (+)   Anesthesia Other Findings Day of surgery medications reviewed with the patient.  Reproductive/Obstetrics                             Anesthesia Physical Anesthesia Plan  ASA: 4  Anesthesia Plan: MAC   Post-op Pain Management:    Induction: Intravenous  PONV Risk Score and Plan: 2 and Treatment may vary due to age or medical condition and TIVA  Airway Management Planned: Natural Airway and Simple Face Mask  Additional Equipment:   Intra-op Plan:   Post-operative Plan:   Informed Consent: I have reviewed the patients History and Physical, chart, labs and discussed the procedure including the risks, benefits and alternatives for the proposed anesthesia with the patient or authorized representative who has indicated his/her understanding and acceptance.     Dental advisory given  Plan Discussed with: CRNA and Anesthesiologist  Anesthesia Plan Comments:        Anesthesia Quick Evaluation

## 2022-10-23 ENCOUNTER — Encounter (HOSPITAL_COMMUNITY): Payer: Self-pay | Admitting: Gastroenterology

## 2022-10-23 ENCOUNTER — Ambulatory Visit: Payer: Medicaid Other

## 2022-10-23 NOTE — Anesthesia Postprocedure Evaluation (Signed)
Anesthesia Post Note  Patient: Christine Bernard  Procedure(s) Performed: COLONOSCOPY WITH PROPOFOL (Bilateral) BIOPSY POLYPECTOMY     Patient location during evaluation: Endoscopy Anesthesia Type: MAC Level of consciousness: oriented, awake and alert and awake Pain management: pain level controlled Vital Signs Assessment: post-procedure vital signs reviewed and stable Respiratory status: spontaneous breathing, nonlabored ventilation, respiratory function stable and patient connected to nasal cannula oxygen Cardiovascular status: blood pressure returned to baseline and stable Postop Assessment: no headache, no backache and no apparent nausea or vomiting Anesthetic complications: no   No notable events documented.  Last Vitals:  Vitals:   10/22/22 0820 10/22/22 0828  BP: (!) 157/96 (!) 141/85  Pulse: 99 98  Resp: 19 20  Temp:    SpO2: 93% 92%    Last Pain:  Vitals:   10/22/22 0828  TempSrc:   PainSc: 0-No pain   Pain Goal:                   Santa Lighter

## 2022-10-24 ENCOUNTER — Ambulatory Visit
Admission: RE | Admit: 2022-10-24 | Discharge: 2022-10-24 | Disposition: A | Discharge status: Home / Self Care | Payer: Medicaid Other | Source: Ambulatory Visit | Attending: Family Medicine | Admitting: Family Medicine

## 2022-10-24 DIAGNOSIS — Z1231 Encounter for screening mammogram for malignant neoplasm of breast: Secondary | ICD-10-CM

## 2022-10-24 LAB — SURGICAL PATHOLOGY

## 2022-11-13 ENCOUNTER — Other Ambulatory Visit: Payer: Self-pay | Admitting: Physical Medicine and Rehabilitation

## 2022-11-13 DIAGNOSIS — M545 Low back pain, unspecified: Secondary | ICD-10-CM

## 2022-11-21 ENCOUNTER — Ambulatory Visit
Admission: RE | Admit: 2022-11-21 | Discharge: 2022-11-21 | Disposition: A | Discharge status: Home / Self Care | Payer: Medicaid Other | Source: Ambulatory Visit | Attending: Physical Medicine and Rehabilitation | Admitting: Physical Medicine and Rehabilitation

## 2022-11-21 DIAGNOSIS — M545 Low back pain, unspecified: Secondary | ICD-10-CM

## 2023-01-03 ENCOUNTER — Other Ambulatory Visit: Payer: Self-pay

## 2023-01-03 ENCOUNTER — Encounter: Payer: Self-pay | Admitting: Allergy

## 2023-01-03 ENCOUNTER — Ambulatory Visit (INDEPENDENT_AMBULATORY_CARE_PROVIDER_SITE_OTHER): Payer: Medicaid Other | Admitting: Allergy

## 2023-01-03 VITALS — BP 112/82 | HR 96 | Temp 98.5°F | Resp 20 | Ht 64.0 in | Wt 321.6 lb

## 2023-01-03 DIAGNOSIS — H1013 Acute atopic conjunctivitis, bilateral: Secondary | ICD-10-CM | POA: Diagnosis not present

## 2023-01-03 DIAGNOSIS — J302 Other seasonal allergic rhinitis: Secondary | ICD-10-CM

## 2023-01-03 DIAGNOSIS — J3089 Other allergic rhinitis: Secondary | ICD-10-CM | POA: Diagnosis not present

## 2023-01-03 MED ORDER — LEVOCETIRIZINE DIHYDROCHLORIDE 5 MG PO TABS: 5.0000 mg | ORAL_TABLET | Freq: Every evening | ORAL | 5 refills | Status: AC

## 2023-01-03 MED ORDER — DYMISTA 137-50 MCG/ACT NA SUSP: NASAL | 5 refills | Status: AC

## 2023-01-03 MED ORDER — CROMOLYN SODIUM 4 % OP SOLN: OPHTHALMIC | 5 refills | Status: AC

## 2023-01-03 NOTE — Progress Notes (Signed)
New Patient Note  RE: Christine Bernard MRN: [id number] DOB: Nov 18, 1970 Date of Office Visit: 01/03/2023   Primary care provider: Daisy Floro, MD  Chief Complaint: allergies  History of present illness: Christine Bernard is a 52 y.o. female presenting today for evaluation of allergies.   She reports having a lot of pressure in face, headaches, pain through the cheeks and itching.  She reports these sinus symptoms can occur more with the seasons changes.  She also reports during the seasons can have nasal congestion, throat clearing, sneezing, itchy/watery eyes, ear fullness.  The itching she reports all the time and does not note any rash or hives.  She states she will take OTC antihistamine like claritin/zyrtec and they are hit or miss with the response.  She does use eye drops that is allergy based OTC and it does help when she uses it.  She states she didn't develop allergy issues until her 61s.   No history of asthma, eczema or food allergy.   Review of systems: Review of Systems  Constitutional: Negative.   HENT:         See HPI  Eyes:        See HPI  Respiratory: Negative.    Cardiovascular: Negative.   Gastrointestinal: Negative.   Musculoskeletal: Negative.   Skin: Negative.   Allergic/Immunologic: Negative.   Neurological: Negative.     All other systems negative unless noted above in HPI  Past medical history: Past Medical History:  Diagnosis Date   Abnormal uterine bleeding (AUB)    Complication of anesthesia    Frequent urination    GERD (gastroesophageal reflux disease)    IBS (irritable bowel syndrome)    Sleep apnea    Does not use CPAP diagnosed ~ 10 years   Stroke [facility]) 2002   No residual effects thought to be secondary to Depo Provera use    Past surgical history: Past Surgical History:  Procedure Laterality Date   BIOPSY  10/22/2022   Procedure: BIOPSY;  Surgeon: Kerin Salen, MD;  Location: [facility];  Service: Gastroenterology;;    CHOLECYSTECTOMY N/A 11/07/2012   Procedure: LAPAROSCOPIC CHOLECYSTECTOMY WITH INTRAOPERATIVE CHOLANGIOGRAM;  Surgeon: Wilmon Arms. Corliss Skains, MD;  Location[facility];  Service: General;  Laterality: N/A;   COLONOSCOPY WITH PROPOFOL Bilateral 10/22/2022   Procedure: COLONOSCOPY WITH PROPOFOL;  Surgeon: Kerin Salen, MD;  Location: [facility];  Service: Gastroenterology;  Laterality: Bilateral;   DILATION AND CURETTAGE OF UTERUS N/A 04/13/2021   Procedure: DILATATION AND CURETTAGE;  Surgeon: Carver Fila, MD;  [facility]: [facility];  Service: Gynecology;  Laterality: N/A;   LAPAROSCOPIC TUBAL LIGATION Bilateral 06/29/2015   Procedure: LAPAROSCOPIC BILATERAL TUBAL LIGATION;  Surgeon: Myna Hidalgo, DO;  Location: [facility];  Service: Gynecology;  Laterality: Bilateral;   LEEP  2000   OOPHORECTOMY Bilateral 04/13/2021   Procedure: RIGHT OVARIAN CYSTECTOMY;  Surgeon: Carver Fila, MD;  [facility];  Service: Gynecology;  Laterality: Bilateral;   POLYPECTOMY  10/22/2022   Procedure: POLYPECTOMY;  Surgeon: Kerin Salen, MD;  [facility];  Service: Gastroenterology;;   ROBOTIC ASSISTED TOTAL HYSTERECTOMY Bilateral 04/13/2021   Procedure: XI ROBOTIC ASSISTED TOTAL HYSTERECTOMY WITH BILATERAL SALPINGECTOMY;  Surgeon: Carver Fila, MD;  [facility];  Service: Gynecology;  Laterality: Bilateral;   WISDOM TOOTH EXTRACTION      Family history:  Family History  Problem Relation Age of Onset   Breast cancer Paternal Aunt    Colon cancer Neg Hx  Ovarian cancer Neg Hx    Endometrial cancer Neg Hx    Pancreatic cancer Neg Hx    Prostate cancer Neg Hx     Social history: Lives in a home with carpeting in the bedroom with gas heating and central cooling.  No pets in the home.  No concern for water damage, mildew or roaches in the home.  She is a Veterinary surgeon.  Denies smoke exposures.     Medication List: Current Outpatient Medications  Medication Sig Dispense Refill   cromolyn (OPTICROM)  4 % ophthalmic solution 1-2 drops each eye up to 4 times daily as needed for itchy/water eyes. 10 mL 5   DYMISTA 137-50 MCG/ACT SUSP 2 sprays per nostril 1-2 times daily as needed for stuffy or runny nose. 23 g 5   hydrochlorothiazide (HYDRODIURIL) 25 MG tablet Take 25 mg by mouth every morning.     ibuprofen (ADVIL) 800 MG tablet Take 800 mg by mouth every 8 (eight) hours as needed.     levocetirizine (XYZAL) 5 MG tablet Take 1 tablet (5 mg total) by mouth every evening. 30 tablet 5   losartan (COZAAR) 50 MG tablet Take 50 mg by mouth daily.     OZEMPIC, 0.25 OR 0.5 MG/DOSE, 2 MG/3ML SOPN Inject 0.5 mg into the skin every Sunday.     terbinafine (LAMISIL) 250 MG tablet Take by mouth.     traMADol (ULTRAM) 50 MG tablet Take 50 mg by mouth 3 (three) times daily as needed.     Vitamin D, Ergocalciferol, (DRISDOL) 1.25 MG (50000 UNIT) CAPS capsule Take 50,000 Units by mouth every Monday.     No current facility-administered medications for this visit.    Known medication allergies: No Known Allergies   Physical examination: Blood pressure 112/82, pulse 96, temperature 98.5 F (36.9 C), temperature source Oral, resp. rate 20, height 5\' 4"  (1.626 m), weight (!) 321 lb 9.6 oz (145.9 kg), last menstrual period 03/08/2021, SpO2 94 %.  General: Alert, interactive, in no acute distress. HEENT: PERRLA, TMs pearly gray, turbinates moderately edematous without discharge, post-pharynx non erythematous. Neck: Supple without lymphadenopathy. Lungs: Clear to auscultation without wheezing, rhonchi or rales. {no increased work of breathing. CV: Normal S1, S2 without murmurs. Abdomen: Nondistended, nontender. Skin: Warm and dry, without lesions or rashes. Extremities:  No clubbing, cyanosis or edema. Neuro:   Grossly intact.  Diagnositics/Labs: Allergy testing:   Airborne Adult Perc - 01/03/23 0951     Time Antigen Placed 1610    Allergen Manufacturer Waynette Buttery    Location Back    Number of Test 55     1. Control-Buffer 50% Glycerol Negative    2. Control-Histamine Negative    3. Bahia Negative    4. French Southern Territories Negative    5. Johnson 2+    6. Kentucky Blue Negative    7. Meadow Fescue Negative    8. Perennial Rye Negative    9. Timothy Negative    10. Ragweed Mix Negative    11. Cocklebur 2+    12. Plantain,  English Negative    13. Baccharis Negative    14. Dog Fennel Negative    15. Guernsey Thistle 2+    16. Lamb's Quarters Negative    17. Sheep Sorrell Negative    18. Rough Pigweed Negative    19. Marsh Elder, Rough 2+    20. Mugwort, Common Negative    21. Box, Elder Negative    22. Cedar, red Negative    23. Sweet  Gum Negative    24. Pecan Pollen Negative    25. Pine Mix Negative    26. Walnut, Black Pollen Negative    27. Red Mulberry Negative    28. Ash Mix Negative    29. Birch Mix Negative    30. Beech American 2+    31. Cottonwood, Guinea-[place]issau 2+    32. Hickory, White Negative    33. Maple Mix Negative    34. Oak, Guinea-[place]issau Mix Negative    35. Sycamore Eastern 2+    36. Alternaria Alternata Negative    37. Cladosporium Herbarum Negative    38. Aspergillus Mix Negative    39. Penicillium Mix 2+    40. Bipolaris Sorokiniana (Helminthosporium) Negative    41. Drechslera Spicifera (Curvularia) Negative    42. Mucor Plumbeus 2+    43. Fusarium Moniliforme Negative    44. Aureobasidium Pullulans (pullulara) Negative    45. Rhizopus Oryzae Negative    46. Botrytis Cinera Negative    47. Epicoccum Nigrum Negative    48. Phoma Betae Negative    49. Dust Mite Mix Negative    50. Cat Hair 10,000 BAU/ml Negative    51.  Dog Epithelia Negative    52. Mixed Feathers Negative    53. Horse Epithelia Negative    54. Cockroach, German Negative    55. Tobacco Leaf Negative             Allergy testing results were read and interpreted by provider, documented by clinical staff.   Assessment and plan:  Allergic rhinitis with conjunctivitis - Testing today showed:  grasses, weeds, trees, indoor molds, and outdoor molds - Copy of test results provided.  - Avoidance measures provided. - Start taking: Xyzal (levocetirizine) 5mg  tablet once daily.  This is an antihistamine.  Dymista (fluticasone/azelastine) two sprays per nostril 1-2 times daily as needed for stuffy or runny nose. Cromolyn 1-2 drop each eye up to 4 times a day as needed for itchy/watery eyes.  - You can use an extra dose of the antihistamine, if needed, for breakthrough symptoms.  - Consider nasal saline rinses 1-2 times daily to remove allergens from the nasal cavities as well as help with mucous clearance (this is especially helpful to do before the nasal sprays are given) - Consider allergy shots as a means of long-term control. - Allergy shots "re-train" and "reset" the immune system to ignore environmental allergens and decrease the resulting immune response to those allergens (sneezing, itchy watery eyes, runny nose, nasal congestion, etc).    - Allergy shots improve symptoms in 75-85% of patients.  - We can discuss more at the next appointment if the medications are not working for you.  Follow-up in 4-6 months or sooner if needed  I appreciate the opportunity to take part in [place]'s care. Please do not hesitate to contact me with questions.  Sincerely,   Margo Aye, MD Allergy/Immunology [facility]

## 2023-01-03 NOTE — Patient Instructions (Signed)
-   Testing today showed: grasses, weeds, trees, indoor molds, and outdoor molds - Copy of test results provided.  - Avoidance measures provided. - Start taking: Xyzal (levocetirizine) 5mg  tablet once daily.  This is an antihistamine.  Dymista (fluticasone/azelastine) two sprays per nostril 1-2 times daily as needed for stuffy or runny nose. Cromolyn 1-2 drop each eye up to 4 times a day as needed for itchy/watery eyes.  - You can use an extra dose of the antihistamine, if needed, for breakthrough symptoms.  - Consider nasal saline rinses 1-2 times daily to remove allergens from the nasal cavities as well as help with mucous clearance (this is especially helpful to do before the nasal sprays are given) - Consider allergy shots as a means of long-term control. - Allergy shots "re-train" and "reset" the immune system to ignore environmental allergens and decrease the resulting immune response to those allergens (sneezing, itchy watery eyes, runny nose, nasal congestion, etc).    - Allergy shots improve symptoms in 75-85% of patients.  - We can discuss more at the next appointment if the medications are not working for you.  Follow-up in 4-6 months or sooner if needed

## 2023-01-22 ENCOUNTER — Other Ambulatory Visit (HOSPITAL_COMMUNITY): Payer: Self-pay

## 2023-01-22 MED ORDER — MOUNJARO 10 MG/0.5ML ~~LOC~~ SOAJ
10.0000 mg | SUBCUTANEOUS | 0 refills | Status: DC
  Filled 2023-01-22: qty 2, 28d supply, fill #0

## 2023-02-23 ENCOUNTER — Other Ambulatory Visit (HOSPITAL_COMMUNITY): Payer: Self-pay

## 2023-02-25 ENCOUNTER — Other Ambulatory Visit (HOSPITAL_COMMUNITY): Payer: Self-pay

## 2023-02-25 MED ORDER — MOUNJARO 12.5 MG/0.5ML ~~LOC~~ SOAJ
12.5000 mg | SUBCUTANEOUS | 3 refills | Status: AC
  Filled 2023-02-25 – 2023-03-01 (×3): qty 2, 28d supply, fill #0
  Filled 2023-03-22: qty 2, 28d supply, fill #1
  Filled 2023-05-17: qty 2, 28d supply, fill #2
  Filled 2023-06-10: qty 2, 28d supply, fill #3

## 2023-02-26 ENCOUNTER — Other Ambulatory Visit (HOSPITAL_COMMUNITY): Payer: Self-pay

## 2023-02-26 MED ORDER — PHENTERMINE HCL 37.5 MG PO TABS
18.7500 mg | ORAL_TABLET | Freq: Every day | ORAL | 0 refills | Status: AC
  Filled 2023-02-28: qty 15, 30d supply, fill #0

## 2023-02-27 ENCOUNTER — Other Ambulatory Visit (HOSPITAL_COMMUNITY): Payer: Self-pay

## 2023-02-28 ENCOUNTER — Other Ambulatory Visit (HOSPITAL_COMMUNITY): Payer: Self-pay

## 2023-03-01 ENCOUNTER — Other Ambulatory Visit (HOSPITAL_COMMUNITY): Payer: Self-pay

## 2023-03-02 ENCOUNTER — Other Ambulatory Visit (HOSPITAL_COMMUNITY): Payer: Self-pay

## 2023-03-04 ENCOUNTER — Other Ambulatory Visit (HOSPITAL_COMMUNITY): Payer: Self-pay

## 2023-03-04 MED ORDER — MOUNJARO 12.5 MG/0.5ML ~~LOC~~ SOAJ
12.5000 mg | SUBCUTANEOUS | 0 refills | Status: AC
  Filled 2023-03-04: qty 2, 28d supply, fill #0

## 2023-03-21 ENCOUNTER — Other Ambulatory Visit: Payer: Self-pay

## 2023-03-21 ENCOUNTER — Other Ambulatory Visit (HOSPITAL_COMMUNITY): Payer: Self-pay

## 2023-03-21 MED ORDER — MOUNJARO 12.5 MG/0.5ML ~~LOC~~ SOAJ
12.5000 mg | SUBCUTANEOUS | 0 refills | Status: AC
  Filled 2023-03-21: qty 2, 28d supply, fill #0

## 2023-03-22 ENCOUNTER — Other Ambulatory Visit (HOSPITAL_COMMUNITY): Payer: Self-pay

## 2023-03-25 ENCOUNTER — Other Ambulatory Visit (HOSPITAL_COMMUNITY): Payer: Self-pay

## 2023-04-11 ENCOUNTER — Other Ambulatory Visit (HOSPITAL_COMMUNITY): Payer: Self-pay

## 2023-04-11 MED ORDER — MOUNJARO 12.5 MG/0.5ML ~~LOC~~ SOAJ
12.5000 mg | SUBCUTANEOUS | 0 refills | Status: AC
  Filled 2023-04-11: qty 2, 28d supply, fill #0

## 2023-04-15 ENCOUNTER — Other Ambulatory Visit (HOSPITAL_COMMUNITY): Payer: Self-pay

## 2023-04-15 MED ORDER — MOUNJARO 12.5 MG/0.5ML ~~LOC~~ SOAJ: 12.5000 mg | SUBCUTANEOUS | 0 refills | Status: AC

## 2023-04-16 ENCOUNTER — Other Ambulatory Visit (HOSPITAL_COMMUNITY): Payer: Self-pay

## 2023-04-16 MED ORDER — ACCU-CHEK SOFTCLIX LANCETS MISC
1 refills | Status: DC
  Filled 2023-04-16: qty 100, 34d supply, fill #0

## 2023-04-16 MED ORDER — ACCU-CHEK GUIDE W/DEVICE KIT
PACK | 0 refills | Status: AC
  Filled 2023-04-16: qty 1, 30d supply, fill #0

## 2023-04-16 MED ORDER — ACCU-CHEK GUIDE VI STRP
ORAL_STRIP | 1 refills | Status: DC
  Filled 2023-04-16: qty 100, 30d supply, fill #0
  Filled 2023-05-20: qty 100, 30d supply, fill #1

## 2023-04-25 ENCOUNTER — Other Ambulatory Visit (HOSPITAL_COMMUNITY): Payer: Self-pay

## 2023-04-26 ENCOUNTER — Other Ambulatory Visit (HOSPITAL_COMMUNITY): Payer: Self-pay

## 2023-05-14 ENCOUNTER — Other Ambulatory Visit (HOSPITAL_COMMUNITY): Payer: Self-pay

## 2023-05-14 MED ORDER — MOUNJARO 15 MG/0.5ML ~~LOC~~ SOAJ
SUBCUTANEOUS | 0 refills | Status: DC
  Filled 2023-05-14: qty 2, 30d supply, fill #0

## 2023-05-15 ENCOUNTER — Other Ambulatory Visit (HOSPITAL_COMMUNITY): Payer: Self-pay

## 2023-05-17 ENCOUNTER — Other Ambulatory Visit: Payer: Self-pay

## 2023-05-20 ENCOUNTER — Other Ambulatory Visit (HOSPITAL_COMMUNITY): Payer: Self-pay

## 2023-05-22 ENCOUNTER — Ambulatory Visit: Payer: Medicaid Other | Admitting: Allergy

## 2023-05-25 ENCOUNTER — Encounter (HOSPITAL_COMMUNITY): Payer: Self-pay

## 2023-05-27 ENCOUNTER — Other Ambulatory Visit (HOSPITAL_COMMUNITY): Payer: Self-pay

## 2023-06-07 ENCOUNTER — Other Ambulatory Visit (HOSPITAL_COMMUNITY): Payer: Self-pay

## 2023-06-07 MED ORDER — MOUNJARO 15 MG/0.5ML ~~LOC~~ SOAJ
SUBCUTANEOUS | 3 refills | Status: DC
  Filled 2023-06-07: qty 2, 28d supply, fill #0
  Filled 2023-07-05 – 2023-07-06 (×2): qty 2, 28d supply, fill #1

## 2023-06-10 ENCOUNTER — Other Ambulatory Visit (HOSPITAL_COMMUNITY): Payer: Self-pay

## 2023-06-12 ENCOUNTER — Other Ambulatory Visit (HOSPITAL_COMMUNITY): Payer: Self-pay

## 2023-06-12 ENCOUNTER — Encounter (HOSPITAL_COMMUNITY): Payer: Self-pay

## 2023-06-12 MED ORDER — OMEPRAZOLE 40 MG PO CPDR
40.0000 mg | DELAYED_RELEASE_CAPSULE | Freq: Every day | ORAL | 5 refills | Status: DC
  Filled 2023-06-12: qty 30, 30d supply, fill #0
  Filled 2023-07-08: qty 30, 30d supply, fill #1
  Filled 2023-08-06: qty 30, 30d supply, fill #2
  Filled 2023-09-05: qty 30, 30d supply, fill #3
  Filled 2023-10-03: qty 30, 30d supply, fill #4
  Filled 2023-11-02: qty 30, 30d supply, fill #5

## 2023-06-12 MED ORDER — METOCLOPRAMIDE HCL 10 MG PO TABS
10.0000 mg | ORAL_TABLET | ORAL | 0 refills | Status: AC
  Filled 2023-06-12: qty 1, 1d supply, fill #0

## 2023-06-12 MED ORDER — MOUNJARO 15 MG/0.5ML ~~LOC~~ SOAJ
15.0000 mg | SUBCUTANEOUS | 0 refills | Status: AC
  Filled 2023-06-12 – 2023-06-29 (×11): qty 2, 28d supply, fill #0

## 2023-06-12 MED ORDER — LEVOFLOXACIN 500 MG PO TABS
500.0000 mg | ORAL_TABLET | ORAL | 0 refills | Status: AC
  Filled 2023-06-12: qty 1, 1d supply, fill #0

## 2023-06-12 MED ORDER — APREPITANT 40 MG PO CAPS
40.0000 mg | ORAL_CAPSULE | ORAL | 0 refills | Status: AC
Start: 2023-06-12 — End: ?
  Filled 2023-06-12: qty 1, 1d supply, fill #0
  Filled 2023-06-15 – 2023-06-17 (×2): qty 1, fill #0
  Filled 2023-06-18: qty 1, 1d supply, fill #0

## 2023-06-12 MED ORDER — ONDANSETRON 8 MG PO TBDP
8.0000 mg | ORAL_TABLET | Freq: Three times a day (TID) | ORAL | 1 refills | Status: AC | PRN
  Filled 2023-06-12: qty 30, 10d supply, fill #0

## 2023-06-13 ENCOUNTER — Other Ambulatory Visit (HOSPITAL_COMMUNITY): Payer: Self-pay

## 2023-06-14 ENCOUNTER — Other Ambulatory Visit (HOSPITAL_COMMUNITY): Payer: Self-pay

## 2023-06-15 ENCOUNTER — Other Ambulatory Visit (HOSPITAL_COMMUNITY): Payer: Self-pay

## 2023-06-17 ENCOUNTER — Other Ambulatory Visit (HOSPITAL_COMMUNITY): Payer: Self-pay

## 2023-06-18 ENCOUNTER — Other Ambulatory Visit (HOSPITAL_COMMUNITY): Payer: Self-pay

## 2023-06-19 ENCOUNTER — Other Ambulatory Visit (HOSPITAL_COMMUNITY): Payer: Self-pay

## 2023-06-20 ENCOUNTER — Other Ambulatory Visit (HOSPITAL_COMMUNITY): Payer: Self-pay

## 2023-06-21 ENCOUNTER — Other Ambulatory Visit: Payer: Self-pay

## 2023-06-22 ENCOUNTER — Other Ambulatory Visit (HOSPITAL_COMMUNITY): Payer: Self-pay

## 2023-06-25 ENCOUNTER — Other Ambulatory Visit (HOSPITAL_COMMUNITY): Payer: Self-pay

## 2023-06-28 ENCOUNTER — Encounter (HOSPITAL_COMMUNITY): Payer: Self-pay

## 2023-06-28 ENCOUNTER — Other Ambulatory Visit (HOSPITAL_COMMUNITY): Payer: Self-pay

## 2023-06-29 ENCOUNTER — Other Ambulatory Visit (HOSPITAL_COMMUNITY): Payer: Self-pay

## 2023-07-05 ENCOUNTER — Other Ambulatory Visit (HOSPITAL_COMMUNITY): Payer: Self-pay

## 2023-07-09 ENCOUNTER — Ambulatory Visit: Payer: Medicaid Other | Admitting: Podiatry

## 2023-07-09 ENCOUNTER — Other Ambulatory Visit (HOSPITAL_COMMUNITY): Payer: Self-pay

## 2023-07-11 ENCOUNTER — Other Ambulatory Visit (HOSPITAL_COMMUNITY): Payer: Self-pay

## 2023-08-06 ENCOUNTER — Other Ambulatory Visit (HOSPITAL_COMMUNITY): Payer: Self-pay

## 2023-08-09 ENCOUNTER — Ambulatory Visit: Payer: Medicaid Other | Admitting: Podiatry

## 2023-08-09 ENCOUNTER — Encounter: Payer: Self-pay | Admitting: Podiatry

## 2023-08-09 DIAGNOSIS — Z79899 Other long term (current) drug therapy: Secondary | ICD-10-CM | POA: Diagnosis not present

## 2023-08-09 DIAGNOSIS — B351 Tinea unguium: Secondary | ICD-10-CM

## 2023-08-09 NOTE — Progress Notes (Signed)
 Subjective:   Patient ID: Christine Bernard, female   DOB: 53 y.o.   MRN: [id number]   HPI Chief Complaint  Patient presents with   Nail Problem    RM#11 Patient states having issues with discoloration of toe nails both feet possible fungus.   53 year old female presents the office today with above concerns.  She had the nails to clear previously when she was on terbinafine  however they have come back discolored.  No drainage or pus or any significant tenderness at this time.   Review of Systems  All other systems reviewed and are negative.  Past Medical History:  Diagnosis Date   Abnormal uterine bleeding (AUB)    Complication of anesthesia    Frequent urination    GERD (gastroesophageal reflux disease)    IBS (irritable bowel syndrome)    Sleep apnea    Does not use CPAP diagnosed ~ 10 years   Stroke [facility]) 2002   No residual effects thought to be secondary to Depo Provera  use    Past Surgical History:  Procedure Laterality Date   BIOPSY  10/22/2022   Procedure: BIOPSY;  Surgeon: Saintclair Jasper, MD;  Location: [facility];  Service: Gastroenterology;;   CHOLECYSTECTOMY N/A 11/07/2012   Procedure: LAPAROSCOPIC CHOLECYSTECTOMY WITH INTRAOPERATIVE CHOLANGIOGRAM;  Surgeon: Donnice POUR. Belinda, MD;  Location: [facility];  Service: General;  Laterality: N/A;   COLONOSCOPY WITH PROPOFOL  Bilateral 10/22/2022   Procedure: COLONOSCOPY WITH PROPOFOL ;  Surgeon: Saintclair Jasper, MD;  [facility];  Service: Gastroenterology;  Laterality: Bilateral;   DILATION AND CURETTAGE OF UTERUS N/A 04/13/2021   Procedure: DILATATION AND CURETTAGE;  Surgeon: Viktoria Comer SAUNDERS, MD;  [facility];  Service: Gynecology;  Laterality: N/A;   LAPAROSCOPIC TUBAL LIGATION Bilateral 06/29/2015   Procedure: LAPAROSCOPIC BILATERAL TUBAL LIGATION;  Surgeon: Delon Prude, DO;  Location: [facility];  Service: Gynecology;  Laterality: Bilateral;   LEEP  2000   OOPHORECTOMY Bilateral 04/13/2021   Procedure: RIGHT OVARIAN  CYSTECTOMY;  Surgeon: Viktoria Comer SAUNDERS, MD;  [facility]: [facility];  Service: Gynecology;  Laterality: Bilateral;   POLYPECTOMY  10/22/2022   Procedure: POLYPECTOMY;  Surgeon: Saintclair Jasper, MD;  [facility];  Service: Gastroenterology;;   ROBOTIC ASSISTED TOTAL HYSTERECTOMY Bilateral 04/13/2021   Procedure: XI ROBOTIC ASSISTED TOTAL HYSTERECTOMY WITH BILATERAL SALPINGECTOMY;  Surgeon: Viktoria Comer SAUNDERS, MD;  Location: [facility];  Service: Gynecology;  Laterality: Bilateral;   WISDOM TOOTH EXTRACTION       Current Outpatient Medications:    aprepitant  (EMEND ) 40 MG capsule, Take 1 capsule (40 mg total) by mouth at 5am on the morning of surgery., Disp: 1 capsule, Rfl: 0   Blood Glucose Monitoring Suppl (ACCU-CHEK GUIDE) w/Device KIT, As directed, Disp: 1 kit, Rfl: 0   cromolyn  (OPTICROM ) 4 % ophthalmic solution, 1-2 drops each eye up to 4 times daily as needed for itchy/water  eyes., Disp: 10 mL, Rfl: 5   DYMISTA  137-50 MCG/ACT SUSP, 2 sprays per nostril 1-2 times daily as needed for stuffy or runny nose., Disp: 23 g, Rfl: 5   hydrochlorothiazide (HYDRODIURIL) 25 MG tablet, Take 25 mg by mouth every morning., Disp: , Rfl:    ibuprofen  (ADVIL ) 800 MG tablet, Take 800 mg by mouth every 8 (eight) hours as needed., Disp: , Rfl:    levocetirizine (XYZAL ) 5 MG tablet, Take 1 tablet (5 mg total) by mouth every evening., Disp: 30 tablet, Rfl: 5   levofloxacin  (LEVAQUIN ) 500 MG tablet, Take 1 tablet (500 mg total) by mouth  the night before surgery between 9pm and 11pm, Disp: 1 tablet, Rfl: 0   losartan (COZAAR) 50 MG tablet, Take 50 mg by mouth daily., Disp: , Rfl:    metoCLOPramide  (REGLAN ) 10 MG tablet, Take 1 tablet (10 mg total) by mouth at 5am on the morning of surgery., Disp: 1 tablet, Rfl: 0   omeprazole  (PRILOSEC) 40 MG capsule, Take 1 capsule (40 mg total) by mouth daily. Take one capsule the night before surgery between 9pm and 11pm and then one capsule daily., Disp: 30 capsule, Rfl: 5    ondansetron  (ZOFRAN -ODT) 8 MG disintegrating tablet, Take 1 tablet (8 mg total) by mouth every 8 (eight) hours as needed for Nausea for up to 7 days., Disp: 30 tablet, Rfl: 1   OZEMPIC, 0.25 OR 0.5 MG/DOSE, 2 MG/3ML SOPN, Inject 0.5 mg into the skin every Sunday., Disp: , Rfl:    phentermine  (ADIPEX-P ) 37.5 MG tablet, Take 1/2 tablet by mouth daily before breakfast., Disp: 15 tablet, Rfl: 0   terbinafine  (LAMISIL ) 250 MG tablet, Take by mouth., Disp: , Rfl:    tirzepatide  (MOUNJARO ) 12.5 MG/0.5ML Pen, Inject 12.5 mg into the skin once a week., Disp: 2 mL, Rfl: 3   tirzepatide  (MOUNJARO ) 12.5 MG/0.5ML Pen, Inject 12.5 mg into the skin once a week., Disp: 2 mL, Rfl: 0   tirzepatide  (MOUNJARO ) 12.5 MG/0.5ML Pen, Inject 12.5 mg into the skin once a week., Disp: 2 mL, Rfl: 0   tirzepatide  (MOUNJARO ) 12.5 MG/0.5ML Pen, Inject 12.5 mg into the skin once a week., Disp: 2 mL, Rfl: 0   tirzepatide  (MOUNJARO ) 12.5 MG/0.5ML Pen, Inject 12.5 mg into the skin once a week., Disp: 2 mL, Rfl: 0   tirzepatide  (MOUNJARO ) 15 MG/0.5ML Pen, Inject 15 mg into the skin once a week., Disp: 2 mL, Rfl: 0   traMADol  (ULTRAM ) 50 MG tablet, Take 50 mg by mouth 3 (three) times daily as needed., Disp: , Rfl:    Vitamin D, Ergocalciferol, (DRISDOL) 1.25 MG (50000 UNIT) CAPS capsule, Take 50,000 Units by mouth every Monday., Disp: , Rfl:   No Known Allergies        Objective:  Physical Exam  General: AAO x3, NAD  Dermatological: Nails are hypertrophic and dystrophic with discoloration present.  No hyperpigmentation of the surrounding skin.  No edema, erythema or signs of infection.  No open lesions.  Vascular: Dorsalis Pedis artery and Posterior Tibial artery pedal pulses are 2/4 bilateral with immedate capillary fill time.  There is no pain with calf compression, swelling, warmth, erythema.   Neruologic: Grossly intact via light touch bilateral.   Musculoskeletal: No other areas of discomfort.  Gait: Unassisted,  Nonantalgic.       Assessment:   Onychomycosis     Plan:  -Treatment options discussed including all alternatives, risks, and complications -Etiology of symptoms were discussed -We discussed oral, topical wound treatments for nail fungus.  She did well with oral medication previously.  Will likely do Lamisil  again we discussed side effects, success rates.  Will check CBC, LFT prior to starting medication.  Return in about 3 months (around 11/07/2023) for nail fungus, lamisil  check .  Donnice JONELLE Fees DPM

## 2023-08-10 ENCOUNTER — Encounter: Payer: Self-pay | Admitting: Podiatry

## 2023-08-10 LAB — CBC WITH DIFFERENTIAL/PLATELET
Basophils Absolute: 0 10*3/uL (ref 0.0–0.2)
Basos: 0 %
EOS (ABSOLUTE): 0.1 10*3/uL (ref 0.0–0.4)
Eos: 2 %
Hematocrit: 37.6 % (ref 34.0–46.6)
Hemoglobin: 12.3 g/dL (ref 11.1–15.9)
Immature Grans (Abs): 0 10*3/uL (ref 0.0–0.1)
Immature Granulocytes: 0 %
Lymphocytes Absolute: 1.8 10*3/uL (ref 0.7–3.1)
Lymphs: 29 %
MCH: 29.4 pg (ref 26.6–33.0)
MCHC: 32.7 g/dL (ref 31.5–35.7)
MCV: 90 fL (ref 79–97)
Monocytes Absolute: 0.4 10*3/uL (ref 0.1–0.9)
Monocytes: 7 %
Neutrophils Absolute: 3.8 10*3/uL (ref 1.4–7.0)
Neutrophils: 62 %
Platelets: 249 10*3/uL (ref 150–450)
RBC: 4.18 x10E6/uL (ref 3.77–5.28)
RDW: 13.4 % (ref 11.7–15.4)
WBC: 6.1 10*3/uL (ref 3.4–10.8)

## 2023-08-10 LAB — HEPATIC FUNCTION PANEL
ALT: 63 [IU]/L — ABNORMAL HIGH (ref 0–32)
AST: 25 [IU]/L (ref 0–40)
Albumin: 3.9 g/dL (ref 3.9–4.9)
Alkaline Phosphatase: 68 [IU]/L (ref 44–121)
Bilirubin Total: 0.3 mg/dL (ref 0.0–1.2)
Bilirubin, Direct: 0.12 mg/dL (ref 0.00–0.40)
Total Protein: 6.7 g/dL (ref 6.0–8.5)

## 2023-08-14 ENCOUNTER — Other Ambulatory Visit: Payer: Self-pay | Admitting: Podiatry

## 2023-08-14 MED ORDER — CICLOPIROX 8 % EX SOLN: Freq: Every day | CUTANEOUS | 2 refills | Status: DC

## 2023-09-30 ENCOUNTER — Other Ambulatory Visit (HOSPITAL_COMMUNITY): Payer: Self-pay

## 2023-09-30 DIAGNOSIS — Z1231 Encounter for screening mammogram for malignant neoplasm of breast: Secondary | ICD-10-CM

## 2023-10-07 ENCOUNTER — Other Ambulatory Visit: Payer: Self-pay | Admitting: Family Medicine

## 2023-10-07 DIAGNOSIS — Z1231 Encounter for screening mammogram for malignant neoplasm of breast: Secondary | ICD-10-CM

## 2023-10-09 ENCOUNTER — Other Ambulatory Visit (HOSPITAL_COMMUNITY): Payer: Self-pay

## 2023-10-09 MED ORDER — TRAZODONE HCL 50 MG PO TABS
50.0000 mg | ORAL_TABLET | Freq: Every day | ORAL | 3 refills | Status: DC
Start: 2023-10-09 — End: 2024-03-23
  Filled 2023-10-09: qty 60, 30d supply, fill #0
  Filled 2023-11-03: qty 60, 30d supply, fill #1
  Filled 2023-11-19 – 2024-01-15 (×3): qty 60, 30d supply, fill #2
  Filled 2024-02-26: qty 60, 30d supply, fill #3

## 2023-10-18 ENCOUNTER — Other Ambulatory Visit (HOSPITAL_COMMUNITY): Payer: Self-pay

## 2023-11-06 ENCOUNTER — Ambulatory Visit
Admission: RE | Admit: 2023-11-06 | Discharge: 2023-11-06 | Disposition: A | Discharge status: Home / Self Care | Payer: Medicaid Other | Source: Ambulatory Visit

## 2023-11-06 DIAGNOSIS — Z1231 Encounter for screening mammogram for malignant neoplasm of breast: Secondary | ICD-10-CM

## 2023-11-07 ENCOUNTER — Other Ambulatory Visit (HOSPITAL_COMMUNITY): Payer: Self-pay

## 2023-11-07 ENCOUNTER — Encounter: Payer: Medicaid Other | Admitting: Obstetrics and Gynecology

## 2023-11-07 MED ORDER — MOUNJARO 2.5 MG/0.5ML ~~LOC~~ SOAJ
2.5000 mg | SUBCUTANEOUS | 0 refills | Status: DC
  Filled 2023-11-07: qty 2, 28d supply, fill #0

## 2023-11-20 ENCOUNTER — Other Ambulatory Visit (HOSPITAL_COMMUNITY): Payer: Self-pay

## 2023-11-25 ENCOUNTER — Telehealth: Payer: Self-pay

## 2023-11-25 NOTE — Patient Outreach (Signed)
 Call from past PREP participant called and asked to call his friend who is interested in PREP.  Call to pt. Discussed program and asked she reach to her PCP and ask for a referral to be sent to [email or web address].  Informed coach will contact her when referral is received.  Information also sent to Health Coach at PREP to watch for referral.

## 2023-11-29 ENCOUNTER — Other Ambulatory Visit (HOSPITAL_COMMUNITY): Payer: Self-pay

## 2023-11-30 ENCOUNTER — Other Ambulatory Visit (HOSPITAL_COMMUNITY): Payer: Self-pay

## 2023-11-30 MED ORDER — MOUNJARO 5 MG/0.5ML ~~LOC~~ SOAJ
5.0000 mg | SUBCUTANEOUS | 0 refills | Status: DC
  Filled 2023-11-30: qty 2, 28d supply, fill #0

## 2023-12-02 ENCOUNTER — Other Ambulatory Visit (HOSPITAL_COMMUNITY): Payer: Self-pay

## 2023-12-03 ENCOUNTER — Other Ambulatory Visit (HOSPITAL_COMMUNITY): Payer: Self-pay

## 2023-12-03 MED ORDER — OMEPRAZOLE 40 MG PO CPDR
40.0000 mg | DELAYED_RELEASE_CAPSULE | Freq: Every day | ORAL | 5 refills | Status: DC
  Filled 2023-12-03: qty 30, 30d supply, fill #0
  Filled 2023-12-31: qty 30, 30d supply, fill #1

## 2023-12-06 ENCOUNTER — Other Ambulatory Visit (HOSPITAL_COMMUNITY): Payer: Self-pay

## 2023-12-06 MED ORDER — TIZANIDINE HCL 2 MG PO TABS
2.0000 mg | ORAL_TABLET | Freq: Two times a day (BID) | ORAL | 0 refills | Status: AC | PRN
  Filled 2023-12-06: qty 30, 15d supply, fill #0

## 2023-12-12 ENCOUNTER — Other Ambulatory Visit: Payer: Self-pay

## 2023-12-17 ENCOUNTER — Other Ambulatory Visit: Payer: Self-pay

## 2023-12-17 NOTE — Progress Notes (Signed)
 Patient came to mobile screening on 12[place]. Pt blood pressure was elevated. We discussed means of reducing hypertension. Its recommended that pt goes to PCP. Pt documented no SDOH needs currently.

## 2023-12-23 ENCOUNTER — Other Ambulatory Visit (HOSPITAL_COMMUNITY): Payer: Self-pay

## 2023-12-23 MED ORDER — MOUNJARO 5 MG/0.5ML ~~LOC~~ SOAJ
5.0000 mg | SUBCUTANEOUS | 0 refills | Status: AC
  Filled 2023-12-23: qty 2, 28d supply, fill #0

## 2024-01-02 ENCOUNTER — Other Ambulatory Visit (HOSPITAL_COMMUNITY): Payer: Self-pay

## 2024-01-02 MED ORDER — MOUNJARO 7.5 MG/0.5ML ~~LOC~~ SOAJ
7.5000 mg | SUBCUTANEOUS | 0 refills | Status: AC
  Filled 2024-01-02 – 2024-01-07 (×5): qty 2, 28d supply, fill #0

## 2024-01-03 ENCOUNTER — Other Ambulatory Visit (HOSPITAL_COMMUNITY): Payer: Self-pay

## 2024-01-04 ENCOUNTER — Other Ambulatory Visit (HOSPITAL_COMMUNITY): Payer: Self-pay

## 2024-01-06 ENCOUNTER — Other Ambulatory Visit (HOSPITAL_COMMUNITY): Payer: Self-pay

## 2024-01-07 ENCOUNTER — Other Ambulatory Visit: Payer: Self-pay

## 2024-01-07 ENCOUNTER — Other Ambulatory Visit (HOSPITAL_COMMUNITY): Payer: Self-pay

## 2024-01-09 ENCOUNTER — Other Ambulatory Visit (HOSPITAL_COMMUNITY): Payer: Self-pay

## 2024-01-18 ENCOUNTER — Other Ambulatory Visit (HOSPITAL_COMMUNITY): Payer: Self-pay

## 2024-01-20 ENCOUNTER — Other Ambulatory Visit (HOSPITAL_COMMUNITY): Payer: Self-pay

## 2024-01-30 ENCOUNTER — Other Ambulatory Visit (HOSPITAL_COMMUNITY): Payer: Self-pay

## 2024-02-04 ENCOUNTER — Other Ambulatory Visit (HOSPITAL_COMMUNITY): Payer: Self-pay

## 2024-02-04 MED ORDER — MOUNJARO 10 MG/0.5ML ~~LOC~~ SOAJ
10.0000 mg | SUBCUTANEOUS | 0 refills | Status: DC
  Filled 2024-02-04: qty 2, 28d supply, fill #0

## 2024-02-05 ENCOUNTER — Other Ambulatory Visit (HOSPITAL_COMMUNITY): Payer: Self-pay

## 2024-02-26 ENCOUNTER — Other Ambulatory Visit (HOSPITAL_COMMUNITY): Payer: Self-pay

## 2024-02-26 MED ORDER — MOUNJARO 10 MG/0.5ML ~~LOC~~ SOAJ
10.0000 mg | SUBCUTANEOUS | 0 refills | Status: DC
  Filled 2024-02-26: qty 2, 28d supply, fill #0

## 2024-03-06 NOTE — Progress Notes (Signed)
 The patient attended a screening event on 12/14/2023, where her blood pressure was measured at 133/84 mmHg after a second check. During the event, the patient reported that she does not smoke, has Medicaid for her insurance, is established with a primary care provider [facility]), and did not have any SDOH needs.  A chart review confirmed that the patient has Dr. Carlin Dale [facility] as her PCP. The patient's most recent office visit was on She has Medicaid as her insurance and no SDOH needs.   CHW called PCP office to obtain PCP status and upcoming appts. The office shared that the pt is under their care and saw her most recent appt was in January 2025. The pt does not have any upcoming appts but the office noted her annual would be around January 2026. CHW shared screening results with the office. The office requested the pt's results to be faxed to them as well. CHW sent courtesy letter with healthy cooking class and bp education in case needed by pt.   At this time, no additional support from the Health Equity Team is indicated.

## 2024-03-21 ENCOUNTER — Other Ambulatory Visit (HOSPITAL_COMMUNITY): Payer: Self-pay

## 2024-03-23 ENCOUNTER — Other Ambulatory Visit (HOSPITAL_COMMUNITY): Payer: Self-pay

## 2024-03-23 MED ORDER — TRAZODONE HCL 50 MG PO TABS
50.0000 mg | ORAL_TABLET | Freq: Every evening | ORAL | 3 refills | Status: AC | PRN
  Filled 2024-03-23: qty 180, 90d supply, fill #0

## 2024-03-25 ENCOUNTER — Other Ambulatory Visit (HOSPITAL_COMMUNITY): Payer: Self-pay

## 2024-03-25 MED ORDER — MOUNJARO 10 MG/0.5ML ~~LOC~~ SOAJ
10.0000 mg | SUBCUTANEOUS | 0 refills | Status: DC
  Filled 2024-03-25: qty 2, 28d supply, fill #0

## 2024-04-02 ENCOUNTER — Other Ambulatory Visit (HOSPITAL_COMMUNITY): Payer: Self-pay

## 2024-04-02 MED ORDER — MOUNJARO 10 MG/0.5ML ~~LOC~~ SOAJ
10.0000 mg | SUBCUTANEOUS | 0 refills | Status: DC
  Filled 2024-04-02 – 2024-04-22 (×2): qty 2, 28d supply, fill #0

## 2024-04-22 ENCOUNTER — Other Ambulatory Visit (HOSPITAL_COMMUNITY): Payer: Self-pay

## 2024-04-22 ENCOUNTER — Other Ambulatory Visit: Payer: Self-pay

## 2024-04-23 ENCOUNTER — Other Ambulatory Visit (HOSPITAL_COMMUNITY): Payer: Self-pay

## 2024-04-27 ENCOUNTER — Ambulatory Visit (INDEPENDENT_AMBULATORY_CARE_PROVIDER_SITE_OTHER): Admitting: Dermatology

## 2024-04-27 ENCOUNTER — Other Ambulatory Visit: Payer: Self-pay

## 2024-04-27 ENCOUNTER — Other Ambulatory Visit (HOSPITAL_COMMUNITY): Payer: Self-pay

## 2024-04-27 ENCOUNTER — Encounter: Payer: Self-pay | Admitting: Dermatology

## 2024-04-27 VITALS — BP 118/81

## 2024-04-27 DIAGNOSIS — L680 Hirsutism: Secondary | ICD-10-CM | POA: Diagnosis not present

## 2024-04-27 DIAGNOSIS — L731 Pseudofolliculitis barbae: Secondary | ICD-10-CM

## 2024-04-27 DIAGNOSIS — B351 Tinea unguium: Secondary | ICD-10-CM | POA: Diagnosis not present

## 2024-04-27 DIAGNOSIS — L810 Postinflammatory hyperpigmentation: Secondary | ICD-10-CM | POA: Diagnosis not present

## 2024-04-27 MED ORDER — SPIRONOLACTONE 100 MG PO TABS
100.0000 mg | ORAL_TABLET | Freq: Every day | ORAL | 3 refills | Status: DC
  Filled 2024-04-27: qty 30, 30d supply, fill #0
  Filled 2024-05-21: qty 30, 30d supply, fill #1
  Filled 2024-06-20: qty 30, 30d supply, fill #2

## 2024-04-27 MED ORDER — JUBLIA 10 % EX SOLN: 1.0000 | Freq: Every evening | CUTANEOUS | 6 refills | Status: DC

## 2024-04-27 MED ORDER — TRETINOIN 0.025 % EX CREA
TOPICAL_CREAM | Freq: Every day | CUTANEOUS | 3 refills | Status: AC
  Filled 2024-04-27: qty 45, 30d supply, fill #0
  Filled 2024-05-21: qty 45, 30d supply, fill #1
  Filled 2024-06-20: qty 60, 30d supply, fill #2
  Filled 2024-06-30: qty 40, 30d supply, fill #2
  Filled 2024-08-07: qty 45, 30d supply, fill #2

## 2024-04-27 NOTE — Progress Notes (Unsigned)
   New Patient Visit   Subjective  Christine Bernard is a 53 y.o. female who presents for the following: Dark spots of face for years. She thinks they may have come from shaving. She shaves twice daily. She has never tried to treat them.  She has thinning toenails and they split. That has been going on for 6-12 months. She typically does not wear nail polish. She has taken Lamisil  in the past.  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  The following portions of the chart were reviewed this encounter and updated as appropriate: medications, allergies, medical history  Review of Systems:  No other skin or systemic complaints except as noted in HPI or Assessment and Plan.  Objective  Well appearing patient in no apparent distress; mood and affect are within normal limits.   A focused examination was performed of the following areas: Face, toenails   Relevant exam findings are noted in the Assessment and Plan.    Assessment & Plan   ONYCHOMYCOSIS - EARLY Exam: Thickened toenails with subungal debris c/w onychomycosis  Treatment Plan: Start Jublia  solution nightly.  Advised patient it can take up to a year for nails to grow out.   Recommend moisturizer with urea to feet daily. Recommend replacing nail clipper about a month into treatment.  HIRSUTISM Exam: ***  Treatment Plan: Start sprionolactone 100 mg 1 tablet daily   PSUEDOFOLLICULITIS Exam: ***  Treatment Plan: Mix Eucerin Radiant Tone Dual Serum with pea size amount of tretinoin  0.025% cream and apply to face 2 nights per week. Recommend Eucerin Radiant Tone to face on the other nights.      Return in about 4 months (around 08/27/2024) for hirsuitism f/u, pseudofolliculitis f/u, thinning hair.  I, Roseline Hutchinson, CMA, am acting as scribe for [employer], DO .   Documentation: I have reviewed the above documentation for accuracy and completeness, and I agree with the above.  Delon Lenis, DO

## 2024-04-27 NOTE — Patient Instructions (Addendum)
 VISIT SUMMARY:  Today, you were seen for concerns about thinning, brittle toenails and dark spots on your face. We discussed your symptoms, and I provided you with a treatment plan to address these issues.  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 Postinflammatory hyperpigmentation refers to dark spots that appear after inflammation, and pseudofolliculitis barbae is irritation caused by shaving.  -To help reduce hair growth, you have been prescribed spironolactone  100mg  to take at night with food. Please monitor for any side effects such as lightheadedness, dizziness, or nausea. - For the dark spots, you will use tretinoin  0.025% cream twice a week and Eucerin Radiant Tone both morning and night.  -Avoid using turmeric cleansing pads right now to prevent further irritation.  -I have also provided samples of La Roche-Posay Lipikar wash and creamy cleanser for your use.  -EARLY ONYCHOMYCOSIS OF TOENAILS:  Onychomycosis is a fungal infection of the toenails. You have been prescribed  -Jublia  topical solution to apply daily to the affected toenails, covering the entire nail and tip. This treatment is preferred for its better penetration and efficacy.  -To prevent reinfection, replace your toenail clippers in one month. I have also provided samples of a moisturizer with salicylic acid for daily use.  -Apply Jublia  to all toenails as a precaution.  INSTRUCTIONS:  Please schedule a follow-up appointment in 3-4 months to assess your progress with the treatments.     Important Information  Due to recent changes in healthcare laws, you may see results of your pathology and/or laboratory studies on MyChart before the doctors have had a chance to review them. We understand that in some cases there may be results that are confusing or concerning to you.  Please understand that not all results are received at the same time and often the doctors may need to interpret multiple results in order to provide you with the best plan of care or course of treatment. Therefore, we ask that you please give us  2 business days to thoroughly review all your results before contacting the office for clarification. Should we see a critical lab result, you will be contacted sooner.   If You Need Anything After Your Visit  If you have any questions or concerns for your doctor, please call our main line at [phone] If no one answers, please leave a voicemail as directed and we will return your call as soon as possible. Messages left after 4 pm will be answered the following business day.   You may also send us  a message via MyChart. We typically respond to MyChart messages within 1-2 business days.  For prescription refills, please ask your pharmacy to contact our office. Our fax number is [phone].  If you have an urgent issue when the clinic is closed that cannot wait until the next business day, you can page your doctor at the number below.    Please note that while we do our best to be available for urgent issues outside of office hours, we are not available 24/7.   If you have an urgent issue and are unable to reach us , you may choose to seek medical care at your doctor's office, retail clinic, urgent care center, or emergency room.  If you have a medical emergency, please immediately call 911 or go to the emergency department. In the event of inclement weather, please call our main line at  [phone] for an update on the status of any delays or closures.  Dermatology Medication Tips: Please keep the boxes that topical medications come in in order to help keep track of the instructions about where and how to use these. Pharmacies typically print the medication instructions only on the boxes and not directly on the medication tubes.   If your  medication is too expensive, please contact our office at [phone] or send us  a message through MyChart.   We are unable to tell what your co-pay for medications will be in advance as this is different depending on your insurance coverage. However, we may be able to find a substitute medication at lower cost or fill out paperwork to get insurance to cover a needed medication.   If a prior authorization is required to get your medication covered by your insurance company, please allow us  1-2 business days to complete this process.  Drug prices often vary depending on where the prescription is filled and some pharmacies may offer cheaper prices.  The website www.goodrx.com contains coupons for medications through different pharmacies. The prices here do not account for what the cost may be with help from insurance (it may be cheaper with your insurance), but the website can give you the price if you did not use any insurance.  - You can print the associated coupon and take it with your prescription to the pharmacy.  - You may also stop by our office during regular business hours and pick up a GoodRx coupon card.  - If you need your prescription sent electronically to a different pharmacy, notify our office through [facility] or by phone at [phone]

## 2024-05-14 ENCOUNTER — Other Ambulatory Visit (HOSPITAL_COMMUNITY): Payer: Self-pay

## 2024-05-14 MED ORDER — MOUNJARO 10 MG/0.5ML ~~LOC~~ SOAJ
SUBCUTANEOUS | 0 refills | Status: AC
  Filled 2024-05-14 – 2024-05-21 (×3): qty 2, 28d supply, fill #0

## 2024-05-18 ENCOUNTER — Other Ambulatory Visit (HOSPITAL_COMMUNITY): Payer: Self-pay

## 2024-05-20 ENCOUNTER — Other Ambulatory Visit (HOSPITAL_COMMUNITY): Payer: Self-pay

## 2024-05-21 ENCOUNTER — Other Ambulatory Visit (HOSPITAL_COMMUNITY): Payer: Self-pay

## 2024-05-21 ENCOUNTER — Other Ambulatory Visit: Payer: Self-pay

## 2024-05-26 ENCOUNTER — Other Ambulatory Visit (HOSPITAL_COMMUNITY): Payer: Self-pay

## 2024-05-30 ENCOUNTER — Other Ambulatory Visit (HOSPITAL_COMMUNITY): Payer: Self-pay

## 2024-06-03 ENCOUNTER — Other Ambulatory Visit (HOSPITAL_COMMUNITY): Payer: Self-pay

## 2024-06-08 ENCOUNTER — Other Ambulatory Visit (HOSPITAL_COMMUNITY): Payer: Self-pay

## 2024-06-08 ENCOUNTER — Encounter: Payer: Self-pay | Admitting: Dermatology

## 2024-06-08 MED ORDER — JUBLIA 10 % EX SOLN
1.0000 | Freq: Every evening | CUTANEOUS | 6 refills | Status: AC
  Filled 2024-06-08: qty 8, 30d supply, fill #0
  Filled 2024-08-07: qty 4, 30d supply, fill #0

## 2024-06-09 ENCOUNTER — Other Ambulatory Visit (HOSPITAL_COMMUNITY): Payer: Self-pay

## 2024-06-09 ENCOUNTER — Telehealth (HOSPITAL_COMMUNITY): Payer: Self-pay

## 2024-06-09 MED ORDER — MOUNJARO 10 MG/0.5ML ~~LOC~~ SOAJ
10.0000 mg | SUBCUTANEOUS | 1 refills | Status: DC
  Filled 2024-06-09 – 2024-06-29 (×3): qty 2, 28d supply, fill #0
  Filled 2024-07-20: qty 2, 28d supply, fill #1

## 2024-06-09 NOTE — Telephone Encounter (Signed)
 PA request has been Received. New Encounter has been or will be created for follow up. For additional info see Pharmacy Prior Auth telephone encounter from 06/09/24.

## 2024-06-09 NOTE — Telephone Encounter (Signed)
 Pharmacy Patient Advocate Encounter   Received notification from Pt Calls Messages that prior authorization for Jublia  10% solution is required/requested.   Insurance verification completed.   The patient is insured through [facility] MEDICAID.   Per test claim: Per test claim, medication is not covered due to plan/benefit exclusion, PA not submitted at this time

## 2024-06-09 NOTE — Telephone Encounter (Signed)
 I have followed up with pt via pt initiated MyChart message in regard to this issue.

## 2024-06-09 NOTE — Telephone Encounter (Signed)
 Hi Shirron, Can you let the pt know that her insurance didn't cover the Jublia  at the [facility] either.  It's currently excluded by her insurance.  We can send an rx for penlac  if she likes (it a different topical that her insurance does cover)  -Dr Alm

## 2024-06-10 ENCOUNTER — Other Ambulatory Visit: Payer: Self-pay

## 2024-06-10 ENCOUNTER — Other Ambulatory Visit (HOSPITAL_COMMUNITY): Payer: Self-pay

## 2024-06-10 MED ORDER — CICLOPIROX 8 % EX SOLN
Freq: Every day | CUTANEOUS | 5 refills | Status: AC
  Filled 2024-06-10: qty 6.6, 30d supply, fill #0
  Filled 2024-08-07: qty 6.6, 30d supply, fill #1

## 2024-06-10 NOTE — Addendum Note (Signed)
 Addended by: Dior Dominik U on: 06/10/2024 08:39 AM   Modules accepted: Orders

## 2024-06-12 ENCOUNTER — Other Ambulatory Visit (HOSPITAL_COMMUNITY): Payer: Self-pay

## 2024-06-16 ENCOUNTER — Other Ambulatory Visit (HOSPITAL_COMMUNITY): Payer: Self-pay

## 2024-06-17 ENCOUNTER — Other Ambulatory Visit (HOSPITAL_COMMUNITY): Payer: Self-pay

## 2024-06-29 ENCOUNTER — Other Ambulatory Visit (HOSPITAL_COMMUNITY): Payer: Self-pay

## 2024-07-11 ENCOUNTER — Other Ambulatory Visit (HOSPITAL_COMMUNITY): Payer: Self-pay

## 2024-07-13 ENCOUNTER — Other Ambulatory Visit (HOSPITAL_COMMUNITY): Payer: Self-pay

## 2024-07-14 ENCOUNTER — Telehealth (HOSPITAL_COMMUNITY): Payer: Self-pay

## 2024-07-14 ENCOUNTER — Telehealth (HOSPITAL_COMMUNITY): Payer: Self-pay | Admitting: Pharmacy Technician

## 2024-07-14 ENCOUNTER — Other Ambulatory Visit (HOSPITAL_COMMUNITY): Payer: Self-pay

## 2024-07-14 ENCOUNTER — Encounter (HOSPITAL_COMMUNITY): Payer: Self-pay

## 2024-07-14 NOTE — Telephone Encounter (Signed)
 PA request has been Received. New Encounter has been or will be created for follow up. For additional info see Pharmacy Prior Auth telephone encounter from 07/14/24.

## 2024-07-14 NOTE — Telephone Encounter (Signed)
 Pharmacy Patient Advocate Encounter   Received notification from Pt Calls Messages that prior authorization for Tretinoin  0.025% cream  is required/requested.   Insurance verification completed.   The patient is insured through [facility] MEDICAID.   Per test claim: PA required; PA submitted to above mentioned insurance via Latent Key/confirmation #/EOC [id number] Status is pending

## 2024-07-15 ENCOUNTER — Other Ambulatory Visit (HOSPITAL_COMMUNITY): Payer: Self-pay

## 2024-07-15 NOTE — Telephone Encounter (Signed)
 Pharmacy Patient Advocate Encounter  Received notification from [facility] MEDICAID that Prior Authorization for Tretinoin  0.025% cream  has been DENIED.  See denial reason below. No denial letter attached in CMM. Will attach denial letter to Media tab once received.   PA #/Case ID/Reference #: [id number]

## 2024-07-16 ENCOUNTER — Other Ambulatory Visit (HOSPITAL_COMMUNITY): Payer: Self-pay

## 2024-07-21 ENCOUNTER — Other Ambulatory Visit (HOSPITAL_COMMUNITY): Payer: Self-pay

## 2024-07-27 ENCOUNTER — Other Ambulatory Visit (HOSPITAL_COMMUNITY): Payer: Self-pay

## 2024-07-27 MED ORDER — MOUNJARO 12.5 MG/0.5ML ~~LOC~~ SOAJ
12.5000 mg | SUBCUTANEOUS | 1 refills | Status: AC
  Filled 2024-07-27 – 2024-08-07 (×2): qty 2, 28d supply, fill #0
  Filled 2024-09-04: qty 2, 28d supply, fill #1

## 2024-08-07 ENCOUNTER — Other Ambulatory Visit (HOSPITAL_COMMUNITY): Payer: Self-pay

## 2024-08-10 ENCOUNTER — Encounter: Payer: Self-pay | Admitting: Plastic Surgery

## 2024-08-10 ENCOUNTER — Ambulatory Visit: Admitting: Plastic Surgery

## 2024-08-10 VITALS — BP 142/93 | HR 80 | Ht 63.0 in | Wt 225.0 lb

## 2024-08-10 DIAGNOSIS — E65 Localized adiposity: Secondary | ICD-10-CM

## 2024-08-10 DIAGNOSIS — Z9884 Bariatric surgery status: Secondary | ICD-10-CM

## 2024-08-10 DIAGNOSIS — M793 Panniculitis, unspecified: Secondary | ICD-10-CM

## 2024-08-10 DIAGNOSIS — R21 Rash and other nonspecific skin eruption: Secondary | ICD-10-CM | POA: Diagnosis not present

## 2024-08-10 NOTE — Progress Notes (Signed)
 "   Referring Provider Okey Carlin Redbird, MD [address] [place],  [place] [address]   CC:  Chief Complaint  Patient presents with   consult      Christine Bernard is an 54 y.o. female.  HPI: Christine Bernard is a 54 year old female who underwent bariatric surgery a little over a year ago.  She has lost in excess of 100 pounds and now has excess skin and fat on the anterior abdominal wall.  The skin frequently has rashes on the posterior aspect and has an odor which she finds objectionable.  The extra skin also gets in the way of wearing her clothing properly.  She is interested in having the pannus excised.  She denies any significant medical problems and has not had DVTs in the past.  No history of C-section.  Allergies[1]  Outpatient Encounter Medications as of 08/10/2024  Medication Sig Note   Efinaconazole  (JUBLIA ) 10 % SOLN Apply topically at bedtime as directed.    tirzepatide  (MOUNJARO ) 10 MG/0.5ML Pen Inject 10 mg into the skin once a week.    traZODone  (DESYREL ) 50 MG tablet Take 1-2 tablets (50-100 mg total) by mouth at bedtime as needed.    tretinoin  (RETIN-A ) 0.025 % cream Apply a pea sized amount to affected areas of face at bedtime 2 nights per week.    aprepitant  (EMEND ) 40 MG capsule Take 1 capsule (40 mg total) by mouth at 5am on the morning of surgery.    Blood Glucose Monitoring Suppl (ACCU-CHEK GUIDE) w/Device KIT As directed    ciclopirox  (PENLAC ) 8 % solution Apply topically at bedtime over nail and surrounding skin. Apply daily over previous coat. After seven (7) days, remove with alcohol and continue cycle.    cromolyn  (OPTICROM ) 4 % ophthalmic solution 1-2 drops each eye up to 4 times daily as needed for itchy/water  eyes.    DYMISTA  137-50 MCG/ACT SUSP 2 sprays per nostril 1-2 times daily as needed for stuffy or runny nose.    hydrochlorothiazide (HYDRODIURIL) 25 MG tablet Take 25 mg by mouth every morning.    ibuprofen  (ADVIL ) 800 MG tablet Take 800 mg by mouth every  8 (eight) hours as needed.    levocetirizine (XYZAL ) 5 MG tablet Take 1 tablet (5 mg total) by mouth every evening.    levofloxacin  (LEVAQUIN ) 500 MG tablet Take 1 tablet (500 mg total) by mouth the night before surgery between 9pm and 11pm    losartan (COZAAR) 50 MG tablet Take 50 mg by mouth daily.    metoCLOPramide  (REGLAN ) 10 MG tablet Take 1 tablet (10 mg total) by mouth at 5am on the morning of surgery.    ondansetron  (ZOFRAN -ODT) 8 MG disintegrating tablet Take 1 tablet (8 mg total) by mouth every 8 (eight) hours as needed for Nausea for up to 7 days.    OZEMPIC, 0.25 OR 0.5 MG/DOSE, 2 MG/3ML SOPN Inject 0.5 mg into the skin every Sunday.    phentermine  (ADIPEX-P ) 37.5 MG tablet Take 1/2 tablet by mouth daily before breakfast.    terbinafine  (LAMISIL ) 250 MG tablet Take by mouth.    tirzepatide  (MOUNJARO ) 10 MG/0.5ML Pen Inject 10 mg into the skin once a week.    tirzepatide  (MOUNJARO ) 12.5 MG/0.5ML Pen Inject 12.5 mg into the skin once a week.    tirzepatide  (MOUNJARO ) 12.5 MG/0.5ML Pen Inject 12.5 mg into the skin once a week.    tirzepatide  (MOUNJARO ) 12.5 MG/0.5ML Pen Inject 12.5 mg into the skin once a week.  tirzepatide  (MOUNJARO ) 12.5 MG/0.5ML Pen Inject 12.5 mg into the skin once a week.    tirzepatide  (MOUNJARO ) 12.5 MG/0.5ML Pen Inject 12.5 mg into the skin once a week.    tirzepatide  (MOUNJARO ) 12.5 MG/0.5ML Pen Inject 12.5 mg into the skin once a week. (Patient not taking: Reported on 08/10/2024)    tirzepatide  (MOUNJARO ) 15 MG/0.5ML Pen Inject 15 mg into the skin once a week.    tirzepatide  (MOUNJARO ) 5 MG/0.5ML Pen Inject 5 mg into the skin once a week.    tirzepatide  (MOUNJARO ) 7.5 MG/0.5ML Pen Inject 7.5 mg into the skin once a week.    tiZANidine  (ZANAFLEX ) 2 MG tablet Take 1 tablet (2 mg total) by mouth 2 (two) times daily as needed.    traMADol  (ULTRAM ) 50 MG tablet Take 50 mg by mouth 3 (three) times daily as needed.    Vitamin D, Ergocalciferol, (DRISDOL) 1.25 MG  (50000 UNIT) CAPS capsule Take 50,000 Units by mouth every Monday.    [DISCONTINUED] omeprazole  (PRILOSEC) 40 MG capsule Take 1 capsule (40 mg total) by mouth the night before surgery between 9 p.m. and 11 p.m. then take 1 capsule (40 mg total) by mouth daily. 01/09/2024: patient states no longer taking   [DISCONTINUED] spironolactone  (ALDACTONE ) 100 MG tablet Take 1 tablet (100 mg total) by mouth daily.    No facility-administered encounter medications on file as of 08/10/2024.     Past Medical History:  Diagnosis Date   Abnormal uterine bleeding (AUB)    Complication of anesthesia    Frequent urination    GERD (gastroesophageal reflux disease)    IBS (irritable bowel syndrome)    Sleep apnea    Does not use CPAP diagnosed ~ 10 years   Stroke [facility]) 2002   No residual effects thought to be secondary to Depo Provera  use    Past Surgical History:  Procedure Laterality Date   BIOPSY  10/22/2022   Procedure: BIOPSY;  Surgeon: Saintclair Jasper, MD;  Location: [facility];  Service: Gastroenterology;;   CHOLECYSTECTOMY N/A 11/07/2012   Procedure: LAPAROSCOPIC CHOLECYSTECTOMY WITH INTRAOPERATIVE CHOLANGIOGRAM;  Surgeon: Donnice POUR. Belinda, MD;  Location: [facility];  Service: General;  Laterality: N/A;   COLONOSCOPY WITH PROPOFOL  Bilateral 10/22/2022   Procedure: COLONOSCOPY WITH PROPOFOL ;  Surgeon: Saintclair Jasper, MD;  Location: [facility];  Service: Gastroenterology;  Laterality: Bilateral;   DILATION AND CURETTAGE OF UTERUS N/A 04/13/2021   Procedure: DILATATION AND CURETTAGE;  Surgeon: Viktoria Comer SAUNDERS, MD;  Location: [facility];  Service: Gynecology;  Laterality: N/A;   LAPAROSCOPIC TUBAL LIGATION Bilateral 06/29/2015   Procedure: LAPAROSCOPIC BILATERAL TUBAL LIGATION;  Surgeon: Delon Prude, DO;  Location: [facility];  Service: Gynecology;  Laterality: Bilateral;   LEEP  2000   OOPHORECTOMY Bilateral 04/13/2021   Procedure: RIGHT OVARIAN CYSTECTOMY;  Surgeon: Viktoria Comer SAUNDERS, MD;  Location: [facility];   Service: Gynecology;  Laterality: Bilateral;   POLYPECTOMY  10/22/2022   Procedure: POLYPECTOMY;  Surgeon: Saintclair Jasper, MD;  Location: [facility];  Service: Gastroenterology;;   ROBOTIC ASSISTED TOTAL HYSTERECTOMY Bilateral 04/13/2021   Procedure: XI ROBOTIC ASSISTED TOTAL HYSTERECTOMY WITH BILATERAL SALPINGECTOMY;  Surgeon: Viktoria Comer SAUNDERS, MD;  Location: [facility];  Service: Gynecology;  Laterality: Bilateral;   WISDOM TOOTH EXTRACTION      Family History  Problem Relation Age of Onset   Breast cancer Paternal Aunt    Colon cancer Neg Hx    Ovarian cancer Neg Hx    Endometrial cancer Neg Hx    Pancreatic  cancer Neg Hx    Prostate cancer Neg Hx     Social History   Social History Narrative   Not on file     Review of Systems General: Denies fevers, chills, weight loss CV: Denies chest pain, shortness of breath, palpitations Abdomen: Excess skin of fat on the anterior abdominal wall  Physical Exam    08/10/2024    3:04 PM 04/27/2024    2:14 PM 12/14/2023    1:10 PM  Vitals with BMI  Height 5' 3    Weight 225 lbs    BMI 39.87    Systolic 142 118 866  Diastolic 93 81 84  Pulse 80      General:  No acute distress,  Alert and oriented, Non-Toxic, Normal speech and affect Abdomen: 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  The pannus also covers both inguinal creases.  No hernias are palpated. 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  We discussed the procedure at length and I showed her the location of the incisions.  We discussed the very high rate of complications including minor wound separation and infection.  We discussed the use of drains postoperatively to decrease the risk of seroma formation we discussed the importance of compression for 6 weeks.  We discussed the postoperative limitations including no heavy lifting  greater than 20 pounds, no vigorous activity, no submerging incisions in water  for 6 weeks.  We did discuss the importance of early ambulation to prevent DVT.  She may return to light activity postoperatively as desired.  All questions were answered to her satisfaction.  Photographs were obtained today with her consent.  Will schedule her for panniculectomy at her request.  Christine Bernard 08/10/2024, 3:59 PM         [1] No Known Allergies  "

## 2024-08-10 NOTE — Telephone Encounter (Signed)
 Hi Brooke,  Can you help check to see if the PA was in coverymy meds?  If not please reach out to the pharmacist and see what next steps we need to do.  I saw this pt in September so I don't know why they are sending this now.  Thanks :)

## 2024-08-12 ENCOUNTER — Ambulatory Visit: Admitting: Dermatology

## 2024-08-12 ENCOUNTER — Other Ambulatory Visit (HOSPITAL_COMMUNITY): Payer: Self-pay

## 2024-08-13 ENCOUNTER — Other Ambulatory Visit: Payer: Self-pay

## 2024-08-17 ENCOUNTER — Other Ambulatory Visit (HOSPITAL_COMMUNITY): Payer: Self-pay

## 2024-08-17 MED ORDER — MOUNJARO 12.5 MG/0.5ML ~~LOC~~ SOAJ
12.5000 mg | SUBCUTANEOUS | 1 refills | Status: AC
  Filled 2024-08-17: qty 2, 28d supply, fill #0

## 2024-08-18 ENCOUNTER — Other Ambulatory Visit (HOSPITAL_COMMUNITY): Payer: Self-pay

## 2024-08-26 ENCOUNTER — Encounter: Payer: Self-pay | Admitting: Plastic Surgery

## 2024-09-01 ENCOUNTER — Other Ambulatory Visit (HOSPITAL_COMMUNITY): Payer: Self-pay

## 2024-09-01 MED ORDER — FLUCONAZOLE 150 MG PO TABS
ORAL_TABLET | ORAL | 3 refills | Status: AC
  Filled 2024-09-01: qty 2, 7d supply, fill #0
  Filled 2024-09-03: qty 2, 7d supply, fill #1

## 2024-09-01 MED ORDER — TRAZODONE HCL 50 MG PO TABS
100.0000 mg | ORAL_TABLET | Freq: Every day | ORAL | 4 refills | Status: AC
  Filled 2024-09-01: qty 180, 90d supply, fill #0

## 2024-09-02 ENCOUNTER — Other Ambulatory Visit (HOSPITAL_COMMUNITY): Payer: Self-pay

## 2024-09-03 ENCOUNTER — Other Ambulatory Visit: Payer: Self-pay

## 2024-09-10 ENCOUNTER — Other Ambulatory Visit (HOSPITAL_COMMUNITY): Payer: Self-pay

## 2024-09-10 MED ORDER — MOUNJARO 12.5 MG/0.5ML ~~LOC~~ SOAJ
12.5000 mg | SUBCUTANEOUS | 1 refills | Status: AC
  Filled 2024-09-10: qty 2, 28d supply, fill #0

## 2024-09-24 ENCOUNTER — Encounter: Admitting: Student

## 2024-10-05 ENCOUNTER — Ambulatory Visit (HOSPITAL_BASED_OUTPATIENT_CLINIC_OR_DEPARTMENT_OTHER): Admit: 2024-10-05 | Admitting: Plastic Surgery

## 2024-10-05 ENCOUNTER — Encounter (HOSPITAL_BASED_OUTPATIENT_CLINIC_OR_DEPARTMENT_OTHER): Payer: Self-pay

## 2024-10-05 SURGERY — PANNICULECTOMY
Anesthesia: Choice

## 2024-10-12 ENCOUNTER — Encounter: Admitting: Plastic Surgery

## 2024-10-29 ENCOUNTER — Encounter: Admitting: Plastic Surgery
